# Patient Record
Sex: Female | Born: 1975 | Hispanic: Yes | Marital: Married | Smoking: Never smoker
Health system: Southern US, Community
[De-identification: ages and names within clinical notes are randomized; demographics above are authoritative.]

## PROBLEM LIST (undated history)

## (undated) ENCOUNTER — Emergency Department (HOSPITAL_COMMUNITY): Admission: EM | Payer: No Typology Code available for payment source

## (undated) DIAGNOSIS — T7840XA Allergy, unspecified, initial encounter: Secondary | ICD-10-CM

## (undated) DIAGNOSIS — Z789 Other specified health status: Secondary | ICD-10-CM

## (undated) HISTORY — DX: Allergy, unspecified, initial encounter: T78.40XA

---

## 2011-12-07 ENCOUNTER — Ambulatory Visit: Payer: Self-pay | Admitting: Family Medicine

## 2011-12-07 VITALS — BP 100/63 | HR 70 | Temp 98.3°F | Resp 16 | Ht 59.0 in | Wt 126.0 lb

## 2011-12-07 DIAGNOSIS — Z0000 Encounter for general adult medical examination without abnormal findings: Secondary | ICD-10-CM

## 2011-12-07 DIAGNOSIS — N898 Other specified noninflammatory disorders of vagina: Secondary | ICD-10-CM

## 2011-12-07 DIAGNOSIS — R300 Dysuria: Secondary | ICD-10-CM

## 2011-12-07 DIAGNOSIS — B9689 Other specified bacterial agents as the cause of diseases classified elsewhere: Secondary | ICD-10-CM

## 2011-12-07 LAB — POCT WET PREP WITH KOH
KOH Prep POC: NEGATIVE
Trichomonas, UA: NEGATIVE
Yeast Wet Prep HPF POC: NEGATIVE

## 2011-12-07 LAB — POCT GLYCOSYLATED HEMOGLOBIN (HGB A1C): Hemoglobin A1C: 5.6

## 2011-12-07 MED ORDER — CIPROFLOXACIN HCL 250 MG PO TABS: 250.0000 mg | ORAL_TABLET | Freq: Two times a day (BID) | ORAL | Status: AC

## 2011-12-07 MED ORDER — METRONIDAZOLE 500 MG PO TABS
500.0000 mg | ORAL_TABLET | Freq: Two times a day (BID) | ORAL | Status: AC
Start: 2011-12-07 — End: 2011-12-17

## 2011-12-07 NOTE — Progress Notes (Signed)
 [facility]:  Office Visit  Chief Complaint:  Chief Complaint  Patient presents with  . Annual Exam    CPE with PAP    HPI: Jenna Mayo is a 36 y.o. female who complains of  CPE with pap. Wants pap. Last pap 2 + years ago. No prior abnormal paps. No STDs.   Past Medical History  Diagnosis Date  . Allergy    Past Surgical History  Procedure Date  . Cesarean section    History   Social History  . Marital Status: Married    Spouse Name: N/A    Number of Children: N/A  . Years of Education: N/A   Social History Main Topics  . Smoking status: Never Smoker   . Smokeless tobacco: None  . Alcohol Use: No  . Drug Use: No  . Sexually Active: None   Other Topics Concern  . None   Social History Narrative  . None   Family History  Problem Relation Age of Onset  . Diabetes Mother   . Diabetes Father    No Known Allergies Prior to Admission medications   Medication Sig Start Date End Date Taking? Authorizing Provider  fexofenadine (ALLEGRA) 180 MG tablet Take 180 mg by mouth daily.   Yes Historical Provider, MD     ROS: The patient denies fevers, chills, night sweats, unintentional weight loss, chest pain, palpitations, wheezing, dyspnea on exertion, nausea, vomiting, abdominal pain, dysuria, hematuria, melena, numbness, weakness, or tingling.   All other systems have been reviewed and were otherwise negative with the exception of those mentioned in the HPI and as above.    PHYSICAL EXAM: Filed Vitals:   12/07/11 1843  BP: 100/63  Pulse: 70  Temp: 98.3 F (36.8 C)  Resp: 16   Filed Vitals:   12/07/11 1843  Height: 4\' 11"  (1.499 m)  Weight: 126 lb (57.153 kg)   Body mass index is 25.45 kg/(m^2).  General: Alert, no acute distress HEENT:  Normocephalic, atraumatic, oropharynx patent.  Cardiovascular:  Regular rate and rhythm, no rubs murmurs or gallops.  No Carotid bruits, radial pulse intact. No pedal edema.  Respiratory: Clear  to auscultation bilaterally.  No wheezes, rales, or rhonchi.  No cyanosis, no use of accessory musculature GI: No organomegaly, abdomen is soft and non-tender, positive bowel sounds.  No masses. Skin: No rashes. Neurologic: Facial musculature symmetric. Psychiatric: Patient is appropriate throughout our interaction. Lymphatic: No cervical lymphadenopathy Musculoskeletal: Gait intact. Breast exam normal GU-thick vaginal DC, no masses,lesions, no CMT, cervix nl   LABS: Results for orders placed in visit on 12/07/11  POCT WET PREP WITH KOH      Component Value Range   Trichomonas, UA Negative     Clue Cells Wet Prep HPF POC 2-4     Epithelial Wet Prep HPF POC 0-3     Yeast Wet Prep HPF POC neg     Bacteria Wet Prep HPF POC 5+     RBC Wet Prep HPF POC 2-5     WBC Wet Prep HPF POC tntc     KOH Prep POC Negative    POCT GLYCOSYLATED HEMOGLOBIN (HGB A1C)      Component Value Range   Hemoglobin A1C 5.6       EKG/XRAY:   Primary read interpreted by Dr. Conley Rolls at [facility].   ASSESSMENT/PLAN: Encounter Diagnoses  Name Primary?  . Physical exam, annual Yes  . Vaginal Discharge   . Dysuria   .  Bacterial vaginosis    Patient is self pay, she declined the normal labs that I would get except for the following: pap with G/C and also HBA1c She was given Cipro for questionable UTI sxs and  rx Flagyl for wet prep that showed BV.  Rx Cipro 250 BID x 3 days Rx Flagyl 500 mg BID x 7 days Pap with G/C pending. Patient is aware that after today if her pap is normal she does not need to return for pap for another 3 years but she still needs to have annual exam with pelvic    ,  PHUONG, DO 12/07/2011 8:07 PM

## 2011-12-12 LAB — PAP IG AND CT-NG NAA
Chlamydia Probe Amp: NEGATIVE
GC Probe Amp: NEGATIVE

## 2011-12-14 ENCOUNTER — Telehealth: Payer: Self-pay | Admitting: Family Medicine

## 2011-12-14 NOTE — Telephone Encounter (Signed)
LM that pap results nl. She does not need to get another pap for 3 more years, annuals still encouraged with pelvic exam.

## 2012-01-03 ENCOUNTER — Encounter: Payer: Self-pay | Admitting: Family Medicine

## 2013-05-21 ENCOUNTER — Ambulatory Visit: Payer: Self-pay | Admitting: Family Medicine

## 2013-05-21 VITALS — BP 130/80 | HR 116 | Temp 100.1°F | Resp 18 | Ht 58.5 in | Wt 137.0 lb

## 2013-05-21 DIAGNOSIS — R509 Fever, unspecified: Secondary | ICD-10-CM

## 2013-05-21 DIAGNOSIS — N390 Urinary tract infection, site not specified: Secondary | ICD-10-CM

## 2013-05-21 DIAGNOSIS — R300 Dysuria: Secondary | ICD-10-CM

## 2013-05-21 LAB — POCT URINALYSIS DIPSTICK
BILIRUBIN UA: NEGATIVE
GLUCOSE UA: NEGATIVE
KETONES UA: NEGATIVE
Nitrite, UA: NEGATIVE
PROTEIN UA: NEGATIVE
SPEC GRAV UA: 1.01
Urobilinogen, UA: 0.2
pH, UA: 7

## 2013-05-21 LAB — POCT UA - MICROSCOPIC ONLY
Casts, Ur, LPF, POC: NEGATIVE
Crystals, Ur, HPF, POC: NEGATIVE
MUCUS UA: POSITIVE
Yeast, UA: NEGATIVE

## 2013-05-21 MED ORDER — CIPROFLOXACIN HCL 500 MG PO TABS: 500.0000 mg | ORAL_TABLET | Freq: Two times a day (BID) | ORAL | Status: DC

## 2013-05-21 MED ORDER — HYDROCODONE-HOMATROPINE 5-1.5 MG/5ML PO SYRP: 5.0000 mL | ORAL_SOLUTION | Freq: Three times a day (TID) | ORAL | Status: DC | PRN

## 2013-05-21 MED ORDER — IBUPROFEN 600 MG PO TABS: 600.0000 mg | ORAL_TABLET | Freq: Three times a day (TID) | ORAL | Status: DC | PRN

## 2013-05-21 NOTE — Patient Instructions (Signed)
Urinary Tract Infection  Urinary tract infections (UTIs) can develop anywhere along your urinary tract. Your urinary tract is your body's drainage system for removing wastes and extra water. Your urinary tract includes two kidneys, two ureters, a bladder, and a urethra. Your kidneys are a pair of bean-shaped organs. Each kidney is about the size of your fist. They are located below your ribs, one on each side of your spine.  CAUSES  Infections are caused by microbes, which are microscopic organisms, including fungi, viruses, and bacteria. These organisms are so small that they can only be seen through a microscope. Bacteria are the microbes that most commonly cause UTIs.  SYMPTOMS   Symptoms of UTIs may vary by age and gender of the patient and by the location of the infection. Symptoms in young women typically include a frequent and intense urge to urinate and a painful, burning feeling in the bladder or urethra during urination. Older women and men are more likely to be tired, shaky, and weak and have muscle aches and abdominal pain. A fever may mean the infection is in your kidneys. Other symptoms of a kidney infection include pain in your back or sides below the ribs, nausea, and vomiting.  DIAGNOSIS  To diagnose a UTI, your caregiver will ask you about your symptoms. Your caregiver also will ask to provide a urine sample. The urine sample will be tested for bacteria and white blood cells. White blood cells are made by your body to help fight infection.  TREATMENT   Typically, UTIs can be treated with medication. Because most UTIs are caused by a bacterial infection, they usually can be treated with the use of antibiotics. The choice of antibiotic and length of treatment depend on your symptoms and the type of bacteria causing your infection.  HOME CARE INSTRUCTIONS   If you were prescribed antibiotics, take them exactly as your caregiver instructs you. Finish the medication even if you feel better after you  have only taken some of the medication.   Drink enough water and fluids to keep your urine clear or pale yellow.   Avoid caffeine, tea, and carbonated beverages. They tend to irritate your bladder.   Empty your bladder often. Avoid holding urine for long periods of time.   Empty your bladder before and after sexual intercourse.   After a bowel movement, women should cleanse from front to back. Use each tissue only once.  SEEK MEDICAL CARE IF:    You have back pain.   You develop a fever.   Your symptoms do not begin to resolve within 3 days.  SEEK IMMEDIATE MEDICAL CARE IF:    You have severe back pain or lower abdominal pain.   You develop chills.   You have nausea or vomiting.   You have continued burning or discomfort with urination.  MAKE SURE YOU:    Understand these instructions.   Will watch your condition.   Will get help right away if you are not doing well or get worse.  Document Released: 01/26/2005 Document Revised: 10/18/2011 Document Reviewed: 05/27/2011  ExitCare Patient Information 2014 [employer].

## 2013-05-21 NOTE — Progress Notes (Signed)
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  Associated headache and nausea, no vomiting.  Lost appetite.    Objective:  Appears febrile but in NAD HEENT:  Unremarkable Neck:  Supple, no adenopathy Chest:  Clear Heart:  Reg, no murmur Skin: clear  Dysuria - Plan: POCT urinalysis dipstick, POCT UA - Microscopic Only, ibuprofen (ADVIL,MOTRIN) 600 MG tablet, HYDROcodone-homatropine (HYCODAN) 5-1.5 MG/5ML syrup, ciprofloxacin (CIPRO) 500 MG tablet  Fever, unspecified - Plan: ibuprofen (ADVIL,MOTRIN) 600 MG tablet, ciprofloxacin (CIPRO) 500 MG tablet  Signed, Robyn Haber, MD

## 2013-12-02 ENCOUNTER — Encounter: Payer: Self-pay | Admitting: Family Medicine

## 2013-12-02 ENCOUNTER — Ambulatory Visit (INDEPENDENT_AMBULATORY_CARE_PROVIDER_SITE_OTHER): Payer: Self-pay | Admitting: Family Medicine

## 2013-12-02 VITALS — BP 102/68 | HR 90 | Temp 98.3°F | Resp 16 | Ht 58.5 in | Wt 138.0 lb

## 2013-12-02 DIAGNOSIS — H8110 Benign paroxysmal vertigo, unspecified ear: Secondary | ICD-10-CM

## 2013-12-02 DIAGNOSIS — G43909 Migraine, unspecified, not intractable, without status migrainosus: Secondary | ICD-10-CM

## 2013-12-02 DIAGNOSIS — R300 Dysuria: Secondary | ICD-10-CM

## 2013-12-02 LAB — POCT UA - MICROSCOPIC ONLY
CASTS, UR, LPF, POC: NEGATIVE
CRYSTALS, UR, HPF, POC: NEGATIVE
Mucus, UA: NEGATIVE
Yeast, UA: NEGATIVE

## 2013-12-02 LAB — POCT URINALYSIS DIPSTICK
Bilirubin, UA: NEGATIVE
Glucose, UA: NEGATIVE
KETONES UA: NEGATIVE
Leukocytes, UA: NEGATIVE
Nitrite, UA: NEGATIVE
PH UA: 5.5
PROTEIN UA: NEGATIVE
SPEC GRAV UA: 1.02
UROBILINOGEN UA: 0.2

## 2013-12-02 LAB — GLUCOSE, POCT (MANUAL RESULT ENTRY): POC Glucose: 115 mg/dl — AB (ref 70–99)

## 2013-12-02 MED ORDER — MECLIZINE HCL 25 MG PO TABS: ORAL_TABLET | ORAL | Status: DC

## 2013-12-02 MED ORDER — TRAMADOL HCL 50 MG PO TABS: ORAL_TABLET | ORAL | Status: DC

## 2013-12-02 MED ORDER — SULFAMETHOXAZOLE-TMP DS 800-160 MG PO TABS: 1.0000 | ORAL_TABLET | Freq: Two times a day (BID) | ORAL | Status: DC

## 2013-12-02 NOTE — Progress Notes (Signed)
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  For the last couple of weeks or so she's been having dizziness. She says if she turned her head quickly or gets up abruptly from bed or even rolls over in bed she'll get dizziness transiently. The last 5 days or so she's been having bad headaches. Especially in the left side but both sides of the head. She had paresthesia of the scalp and a sensation that water was being poured over her head. She said the scalp and cheek cannot hypersensitive to touch. She had no ringing in the ear. No visual changes. No generalized body or unilateral weakness. She did not. She has been nauseous but no vomiting. Her last menstrual cycle was just 3 days ago. She did have a little dysuria and wonders whether she could have something going on there. She does not smoke drink or use drugs. Not using contraception  Objective: Pleasant young lady in no acute distress, alert and oriented. Her TMs are normal. Eyes PERRLA. Her EOMs are intact. Throat clear. Neck supple without nodes. Chest clear. Heart regular without murmurs. Finger to nose normal. Heel toe gait normal. Motor strength symmetrical  Assessment: Benign positional vertigo Migraines Dysuria  Plan: Check urinalysis and glucose   Results for orders placed in visit on 12/02/13  POCT UA - MICROSCOPIC ONLY      Result Value Ref Range   WBC, Ur, HPF, POC 4-6     RBC, urine, microscopic 1-3     Bacteria, U Microscopic 2+     Mucus, UA neg     Epithelial cells, urine per micros 4-6     Crystals, Ur, HPF, POC neg     Casts, Ur, LPF, POC neg     Yeast, UA neg    POCT URINALYSIS DIPSTICK      Result Value Ref Range   Color, UA yellow     Clarity, UA cloudy     Glucose, UA neg     Bilirubin, UA neg     Ketones, UA neg     Spec Grav, UA 1.020     Blood, UA small     pH, UA 5.5     Protein, UA neg     Urobilinogen, UA 0.2     Nitrite, UA neg     Leukocytes, UA Negative    GLUCOSE,  POCT (MANUAL RESULT ENTRY)      Result Value Ref Range   POC Glucose 115 (*) 70 - 99 mg/dl   Tramadol for headache, meclizine for dizziness, and sulfamethoxazole for dysuria possible UTI  If dizziness persists we'll need to make referral to ENT

## 2013-12-02 NOTE — Patient Instructions (Addendum)
Take meclizine one half to one pill 3 times daily when needed for dizziness  Take tramadol one every 8 hours if needed for bad headache. For milder headache take Tylenol or Aleve or ibuprofen.  If not improving we will need to refer you to an ENT specialist for further evaluation  Recommend more regular exercise and making sure you're getting plenty of sleep.  Take sulfamethoxazole one twice daily for 3 days for possible infection of urine  Vrtigo postural benigno (Benign Positional Vertigo)  Vrtigo es la sensacin de que el entorno se mueve estando quieto. Es la forma ms frecuente de vrtigo. Benigno significa que la causa del trastorno no es grave. Es ms frecuente en adultos mayores. CAUSAS  Es el resultado de un trastorno en el sistema laberntico. Es una zona en el odo medio que ayuda a controlar el equilibrio. La causa puede ser una infeccin viral, una lesin en la cabeza o un movimiento repetitivo. Sin embargo, a menudo no se Research scientist (life sciences).  SNTOMAS  Los sntomas de vrtigo posicional benigno se producen al mover la cabeza o los ojos en diferentes direcciones. Algunos de los sntomas pueden ser:   Prdida de equilibrio y cadas.  Vmitos.  Visin borrosa.  Mareos.  Nuseas.  Movimientos oculares involuntarios (nistagmus). DIAGNSTICO  El vrtigo postural benigno se diagnostica mediante un examen fsico. Si la causa especfica de su vrtigo posicional benigno es desconocido, su mdico puede indicar diagnsticos por imgenes, como la Health visitor (RM) o la tomografa computada (TC).  TRATAMIENTO  El [employer] podr recomendar movimientos o procedimientos para corregir el vrtigo posicional benigno. Para tratar los sntomas pueden indicarse medicamentos como meclizina, benzodiazepinas y medicamentos para las nuseas. En casos raros, si los sntomas son causados   por ciertos trastornos que afectan el odo interno, es posible que necesite [place].  [place]part[place] indicaciones del mdico.  Muvase lentamente. No haga movimientos bruscos con la cabeza ni el cuerpo.  Evite conducir vehculos.  Evite operar maquinarias pesadas.  Evite realizar tareas que seran peligrosas para usted u otras personas durante un episodio de vrtigo.  Debe ingerir gran cantidad de lquido para mantener la orina de tono claro o color amarillo plido. SOLICITE ATENCIN [place] DE INMEDIATO SI:   Tiene dificultad para hablar, caminar, siente debilidad o tiene problemas para usar los brazos, las [place] piernas.  Tiene dificultad para respirar.  Sufre un dolor de cabeza intenso.  Las nuseas o los vmitos persisten o [place].  Tiene cambios en la visin.  Sus familiares o amigos notan cambios en su conducta.  El dolor [place].  Tiene fiebre.  Comienza a sentir rigidez en el cuello o sensibilidad a la luz. ASEGRESE DE QUE:   Comprende estas instrucciones.  Controlar su enfermedad.  Solicitar ayuda de inmediato si no mejora o si empeora. Document Released: 08/04/2008 Document Revised: 07/11/2011 [facility] Patient Information 2015 [place]. This information is not intended to replace advice given to you by your health care provider. Make sure you discuss any questions you have with your health care provider.

## 2013-12-05 LAB — URINE CULTURE: Colony Count: >=100000

## 2013-12-06 ENCOUNTER — Telehealth: Payer: Self-pay

## 2013-12-06 DIAGNOSIS — R300 Dysuria: Secondary | ICD-10-CM

## 2013-12-06 MED ORDER — SULFAMETHOXAZOLE-TMP DS 800-160 MG PO TABS: 1.0000 | ORAL_TABLET | Freq: Two times a day (BID) | ORAL | Status: DC

## 2013-12-06 NOTE — Telephone Encounter (Signed)
Advised pt- sent script to the pharmacy. Pt will RTC if symptoms persist to recheck.

## 2013-12-06 NOTE — Telephone Encounter (Signed)
[place]   Patient is requesting her test results of her urine.   Please call [phone]

## 2014-04-10 ENCOUNTER — Ambulatory Visit (INDEPENDENT_AMBULATORY_CARE_PROVIDER_SITE_OTHER): Payer: Self-pay | Admitting: Physician Assistant

## 2014-04-10 VITALS — BP 112/66 | HR 71 | Temp 97.8°F | Resp 16 | Ht 58.5 in | Wt 142.8 lb

## 2014-04-10 DIAGNOSIS — M62838 Other muscle spasm: Secondary | ICD-10-CM

## 2014-04-10 DIAGNOSIS — Z833 Family history of diabetes mellitus: Secondary | ICD-10-CM

## 2014-04-10 DIAGNOSIS — R300 Dysuria: Secondary | ICD-10-CM

## 2014-04-10 DIAGNOSIS — R5383 Other fatigue: Secondary | ICD-10-CM

## 2014-04-10 DIAGNOSIS — R200 Anesthesia of skin: Secondary | ICD-10-CM

## 2014-04-10 DIAGNOSIS — M6248 Contracture of muscle, other site: Secondary | ICD-10-CM

## 2014-04-10 DIAGNOSIS — Z658 Other specified problems related to psychosocial circumstances: Secondary | ICD-10-CM

## 2014-04-10 DIAGNOSIS — Z23 Encounter for immunization: Secondary | ICD-10-CM

## 2014-04-10 DIAGNOSIS — F439 Reaction to severe stress, unspecified: Secondary | ICD-10-CM

## 2014-04-10 DIAGNOSIS — L299 Pruritus, unspecified: Secondary | ICD-10-CM

## 2014-04-10 LAB — POCT CBC
GRANULOCYTE PERCENT: 58 % (ref 37–80)
HCT, POC: 41.4 % (ref 37.7–47.9)
Hemoglobin: 13 g/dL (ref 12.2–16.2)
LYMPH, POC: 2.7 (ref 0.6–3.4)
MCH, POC: 26.8 pg — AB (ref 27–31.2)
MCHC: 31.4 g/dL — AB (ref 31.8–35.4)
MCV: 85.5 fL (ref 80–97)
MID (cbc): 0.4 (ref 0–0.9)
MPV: 7.4 fL (ref 0–99.8)
POC Granulocyte: 4.3 (ref 2–6.9)
POC LYMPH %: 36.5 % (ref 10–50)
POC MID %: 5.5 %M (ref 0–12)
Platelet Count, POC: 241 10*3/uL (ref 142–424)
RBC: 4.84 M/uL (ref 4.04–5.48)
RDW, POC: 16.2 %
WBC: 7.4 10*3/uL (ref 4.6–10.2)

## 2014-04-10 LAB — COMPREHENSIVE METABOLIC PANEL
ALBUMIN: 4.4 g/dL (ref 3.5–5.2)
ALT: 23 U/L (ref 0–35)
AST: 18 U/L (ref 0–37)
Alkaline Phosphatase: 52 U/L (ref 39–117)
BILIRUBIN TOTAL: 0.5 mg/dL (ref 0.2–1.2)
BUN: 4 mg/dL — ABNORMAL LOW (ref 6–23)
CO2: 31 mEq/L (ref 19–32)
Calcium: 9.3 mg/dL (ref 8.4–10.5)
Chloride: 99 mEq/L (ref 96–112)
Creat: 0.56 mg/dL (ref 0.50–1.10)
Glucose, Bld: 95 mg/dL (ref 70–99)
Potassium: 4 mEq/L (ref 3.5–5.3)
Sodium: 137 mEq/L (ref 135–145)
Total Protein: 7.1 g/dL (ref 6.0–8.3)

## 2014-04-10 LAB — POCT URINALYSIS DIPSTICK
BILIRUBIN UA: NEGATIVE
GLUCOSE UA: NEGATIVE
KETONES UA: NEGATIVE
Leukocytes, UA: NEGATIVE
NITRITE UA: NEGATIVE
PH UA: 7
Protein, UA: NEGATIVE
SPEC GRAV UA: 1.01
Urobilinogen, UA: 0.2

## 2014-04-10 LAB — POCT UA - MICROSCOPIC ONLY
BACTERIA, U MICROSCOPIC: NEGATIVE
Casts, Ur, LPF, POC: NEGATIVE
Crystals, Ur, HPF, POC: NEGATIVE
Mucus, UA: NEGATIVE
YEAST UA: NEGATIVE

## 2014-04-10 LAB — POCT GLYCOSYLATED HEMOGLOBIN (HGB A1C): Hemoglobin A1C: 5.3

## 2014-04-10 MED ORDER — CYCLOBENZAPRINE HCL 10 MG PO TABS: 5.0000 mg | ORAL_TABLET | Freq: Three times a day (TID) | ORAL | Status: DC | PRN

## 2014-04-10 NOTE — Progress Notes (Signed)
Subjective:    Patient ID: Jenna Mayo, female    DOB: 1976-02-17, 38 y.o.   MRN: [id number]  PCP: No PCP Per Patient  Chief Complaint  Patient presents with  . Generalized Body Aches    x4 days  . sensitive skin    her skin feels very sensitive to the touch  . Nausea  . Headache  . Anorexia    has not ate anything in 2 days  . Numbness    in hands and feet  . Dizziness  . Chills   There are no active problems to display for this patient.  Prior to Admission medications   Medication Sig Start Date End Date Taking? Authorizing Provider  loratadine (CLARITIN) 10 MG tablet Take 10 mg by mouth daily.   Yes Historical Provider, MD   Medications, allergies, past medical history, surgical history, family history, social history and problem list reviewed and updated.  HPI  66 yof with PMH allergies and strong family hx DMII presents with multiple complaints including body aches, sensitive skin, nausea, HA, anorexia, numbness, dizziness, and chills.  Sx started approx 3-4 days ago with general fatigue. 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 She has been sleeping ok but has just been fatigued throughout the day. She has been eating a little more than normal lately and feels that she has gained a few lbs.   She noticed her skin has been sensitive for the past 2 days. This is all over her body but mostly located across trunk from hips up to shoulders. No issues over extremities. She feels sensitive but also maybe slightly itchy with certain clothes. Denies any new detergents, lotions, soaps, foods, meds.   She had a HA for past couple days which has resolved now. She has also been feeling nauseated the past few days. Has not had much of an appetite. Has been drinking fine. Her throat was also itchy 2 days ago. Last night she was reading about her sx on the internet and became concerned about fibromyalgia. She has been feeling like the water is  "bugging" her when she showers, as if it is irritating her skin.   She mentions that sometimes she will have sensitivity down one extremity. She occasionally feels SOB both at rest and with exertion. She cannot attribute this to anything specific. She denies any CP, PND, orthopnea, palps, fever, chills. Denies cough, ear pain, rhinorrhea, abd pain, diarrhea. Denies any muscle or joint pain though she has felt achy all over the past four days.   She has had intermittent dysuria for the past month.   She has had numbness in both hands and feet intermittently for the past 1-2 days. No aggravating or relieving factors. Denies any weakness.   She is concerned about DM as both her parents have it and brother was recently diagnosed.   Lot of stress recently. Divorced 5 months ago. Moved out for awhile but back in same home with 2 children. Works 60 hrs week as Freight forwarder at [employer]. Working more lately with holiday season.   Was seen here with vertigo earlier in the year. Was told to f/u with ENT if sx persisted, but they have resolved.   Review of Systems No night sweats, no unintentional wt loss. No vaginal dc. No constipation.     Objective:   Physical Exam  Constitutional: She is oriented to person, place, and time. She appears well-developed and well-nourished.  Non-toxic appearance. She does not have a sickly appearance. She does not appear ill. No distress.  BP 112/66 mmHg  Pulse 71  Temp(Src) 97.8 F (36.6 C) (Oral)  Resp 16  Ht 4' 10.5" (1.486 m)  Wt 142 lb 12.8 oz (64.774 kg)  BMI 29.33 kg/m2  SpO2 98%  LMP 04/08/2014   HENT:  Right Ear: Tympanic membrane normal.  Left Ear: Tympanic membrane normal.  Mouth/Throat: Uvula is midline, oropharynx is clear and moist and mucous membranes are normal. Mucous membranes are not dry. No oropharyngeal exudate, posterior oropharyngeal edema or posterior oropharyngeal erythema.  Eyes: Conjunctivae and EOM are normal. Pupils are equal,  round, and reactive to light.  Neck: Normal range of motion. Muscular tenderness present. No spinous process tenderness present. No Brudzinski's sign noted. No thyroid mass present.    Muscle spasm across trapezius muscle.   Cardiovascular: Regular rhythm and normal heart sounds.  Exam reveals no gallop.   No murmur heard. Pulmonary/Chest: Effort normal and breath sounds normal. She has no decreased breath sounds. She has no wheezes. She has no rhonchi. She has no rales.  Abdominal: Soft. Normal appearance and bowel sounds are normal. There is no splenomegaly or hepatomegaly. There is tenderness in the periumbilical area. There is no rigidity, no rebound, no guarding, no CVA tenderness, no tenderness at McBurney's point and negative Murphy's sign.    Very mild TTP just lateral to umbilicus.   Lymphadenopathy:       Head (right side): No submental, no submandibular and no tonsillar adenopathy present.       Head (left side): No submental, no submandibular and no tonsillar adenopathy present.    She has no cervical adenopathy.  Neurological: She is alert and oriented to person, place, and time. She has normal strength. She displays normal reflexes. No cranial nerve deficit or sensory deficit. Coordination and gait normal.  Normal rapid alternating movements, normal heel to shin. Normal proprioception with big toe bilaterally. Normal vibratory sense.   Skin: No rash noted.    Results for orders placed or performed in visit on 04/10/14  POCT UA - Microscopic Only  Result Value Ref Range   WBC, Ur, HPF, POC 0-1    RBC, urine, microscopic 0-1    Bacteria, U Microscopic neg    Mucus, UA neg    Epithelial cells, urine per micros 0-1    Crystals, Ur, HPF, POC neg    Casts, Ur, LPF, POC neg    Yeast, UA neg   POCT urinalysis dipstick  Result Value Ref Range   Color, UA yellow    Clarity, UA clear    Glucose, UA neg    Bilirubin, UA neg    Ketones, UA neg    Spec Grav, UA 1.010    Blood,  UA trace    pH, UA 7.0    Protein, UA neg    Urobilinogen, UA 0.2    Nitrite, UA neg    Leukocytes, UA Negative   POCT CBC  Result Value Ref Range   WBC 7.4 4.6 - 10.2 K/uL   Lymph, poc 2.7 0.6 - 3.4   POC LYMPH PERCENT 36.5 10 - 50 %L   MID (cbc) 0.4 0 - 0.9   POC MID % 5.5 0 - 12 %M   POC Granulocyte 4.3 2 - 6.9   Granulocyte percent 58.0 37 - 80 %G   RBC 4.84 4.04 - 5.48 M/uL   Hemoglobin 13.0 12.2 - 16.2 g/dL  HCT, POC 41.4 37.7 - 47.9 %   MCV 85.5 80 - 97 fL   MCH, POC 26.8 (A) 27 - 31.2 pg   MCHC 31.4 (A) 31.8 - 35.4 g/dL   RDW, POC 16.2 %   Platelet Count, POC 241 142 - 424 K/uL   MPV 7.4 0 - 99.8 fL  POCT glycosylated hemoglobin (Hb A1C)  Result Value Ref Range   Hemoglobin A1C 5.3       Assessment & Plan:   89 yof with PMH allergies and strong family hx DMII presents with multiple complaints including body aches, sensitive skin, nausea, HA, anorexia, numbness, dizziness, and chills.  Dysuria - Plan: POCT UA - Microscopic Only, POCT urinalysis dipstick --ua normal  Other fatigue - Plan: POCT CBC, Comprehensive metabolic panel, TSH Itch of skin --no rash, normal exam --cbc normal today, f/u with tsh, cmp  Family history of diabetes mellitus type II - Plan: POCT glycosylated hemoglobin (Hb A1C) --a1c normal today and actually improved from 2 yrs ago --pt informed and very relieved due to family hx  Numbness - Plan: Comprehensive metabolic panel, Vitamin F29 --1-2 day h/o hand/foot numbness without weakness --cmp, b12 --normal neuro exam with normal sensation  Need for prophylactic vaccination and inoculation against influenza - Plan: CANCELED: Flu Vaccine QUAD 36+ mos IM --pt to get at pharmacy due to cost  Muscle spasms of neck - Plan: cyclobenzaprine (FLEXERIL) 10 MG tablet --flexeril --heat/ice --light rom --massage if possible  Her varied symptoms could be due to psychosomatic syndrome with recent life changes and stress. If remainder of labs come  back normal, along with normal exam today, we will plan to see her back next month after the holidays. Pt informed one possibility could be stress causing her sx. She agrees this may be the cause. We will see how she's doing next month and if still having these sx do a further workup along with talk more about stress and anxiety.   Julieta Gutting, PA-C Physician Assistant-Certified [facility]  04/10/2014 5:30 PM

## 2014-04-10 NOTE — Progress Notes (Signed)
The patient was discussed with me and I agree with the diagnosis and treatment plan.  

## 2014-04-10 NOTE — Patient Instructions (Signed)
Your labs we drew today looked great as did your urine sample. You do not have a urinary tract infection, you do not have any infection, your 3 month blood sugar reading we looked at was great. You are not diabetic or prediabetic. Your sugar actually was better today than it was 2 years ago. I will contact you with the results of the other labs we drew.  If those all come back normal, this may be what is called psychosomatic. This means that the stress you're going through actually causes your body to have discomfort. Stress can actually cause people to have symptoms that you've been having. That is not definitely what is causing your symptoms, but it is one possibility. If your labs all come back normal, please come back to clinic in January after the stress of the holiday season is over. We could further discuss at that time different strategies that may help moving forward.  If you begin to feel any worse in the meantime, please be sure to return to clinic.

## 2014-04-11 LAB — TSH: TSH: 1.497 u[IU]/mL (ref 0.350–4.500)

## 2014-04-11 LAB — VITAMIN B12

## 2015-06-15 ENCOUNTER — Ambulatory Visit (INDEPENDENT_AMBULATORY_CARE_PROVIDER_SITE_OTHER): Payer: Self-pay | Admitting: Family Medicine

## 2015-06-15 ENCOUNTER — Encounter: Payer: Self-pay | Admitting: Family Medicine

## 2015-06-15 VITALS — BP 103/59 | HR 73 | Temp 98.4°F | Ht <= 58 in | Wt 145.4 lb

## 2015-06-15 DIAGNOSIS — F32A Depression, unspecified: Secondary | ICD-10-CM

## 2015-06-15 DIAGNOSIS — Z1151 Encounter for screening for human papillomavirus (HPV): Secondary | ICD-10-CM

## 2015-06-15 DIAGNOSIS — F329 Major depressive disorder, single episode, unspecified: Secondary | ICD-10-CM

## 2015-06-15 DIAGNOSIS — Z01419 Encounter for gynecological examination (general) (routine) without abnormal findings: Secondary | ICD-10-CM

## 2015-06-15 DIAGNOSIS — Z124 Encounter for screening for malignant neoplasm of cervix: Secondary | ICD-10-CM

## 2015-06-15 DIAGNOSIS — H8110 Benign paroxysmal vertigo, unspecified ear: Secondary | ICD-10-CM

## 2015-06-15 LAB — POCT URINALYSIS DIP (DEVICE)
Bilirubin Urine: NEGATIVE
Glucose, UA: NEGATIVE mg/dL
KETONES UR: NEGATIVE mg/dL
Nitrite: NEGATIVE
PROTEIN: NEGATIVE mg/dL
Specific Gravity, Urine: 1.01 (ref 1.005–1.030)
UROBILINOGEN UA: 0.2 mg/dL (ref 0.0–1.0)
pH: 6 (ref 5.0–8.0)

## 2015-06-15 MED ORDER — CITALOPRAM HYDROBROMIDE 20 MG PO TABS: 20.0000 mg | ORAL_TABLET | Freq: Every day | ORAL | Status: DC

## 2015-06-15 NOTE — Progress Notes (Signed)
Here for physical.Requested urinalyis-c/o urinary frequency, urinalyis obtained. C/o depression, headaches, varicose veins.

## 2015-06-15 NOTE — Progress Notes (Signed)
Patient ID: Jenna Mayo, female   DOB: 07-17-1975, 40 y.o.   MRN: [id number]   CLINIC ENCOUNTER NOTE  History:  40 y.o. [id number] here today for routine physical . She denies any abnormal vaginal discharge, bleeding, pelvic pain or other concerns.   Past Medical History  Diagnosis Date  . Allergy    #HCM needs pap and breast cancer screening  #Dizziness- feels like the room is spinning.  - several day, started last Wednesday - overall improving since it started.   #Depression -history of problems in the past -Reports "feeling in a hole" and she reports generally anti-meds  -Denies SI/HI +lack concentration, +sadness, +fatigue/insomnia. +adhedonia PHQ9=14  #Menorrhagia with regular cycle: Menstrual cycle 9 days, uses 6-7 pads per day  Past Surgical History  Procedure Laterality Date  . Cesarean section     The following portions of the patient's history were reviewed and updated as appropriate: allergies, current medications, past family history, past medical history, past social history, past surgical history and problem list.   Health Maintenance:  Normal pap and negative 3 years ago.  Needs mamo   Review of Systems:  Pertinent items noted in HPI and remainder of comprehensive ROS otherwise negative.  Objective:  Physical Exam BP 103/59 mmHg  Pulse 73  Temp(Src) 98.4 F (36.9 C)  Ht 4\' 9"  (1.448 m)  Wt 145 lb 6.4 oz (65.953 kg)  BMI 31.46 kg/m2  LMP 06/12/2015 CONSTITUTIONAL: Well-developed, well-nourished female in no acute distress.  HENT:  Normocephalic, atraumatic. EYES:  No scleral icterus.  NECK: Normal range of motion, supple, no masses SKIN: Skin is warm and dry. No rash noted. Not diaphoretic. No erythema. No pallor. [place]: Alert and oriented to person, place, and time. Normal reflexes, muscle tone coordination. No cranial nerve deficit noted. PSYCHIATRIC: Depressed. +SIGECAPS. Congruent. No SI/HI CARDIOVASCULAR: Normal heart rate  noted RESPIRATORY: Effort and breath sounds normal, no problems with respiration noted ABDOMEN: Soft, no distention noted.   PELVIC: Normal appearing external genitalia; normal appearing vaginal mucosa and cervix.  No abnormal discharge noted.  Normal uterine size, no other palpable masses, no uterine or adnexal tenderness. MUSCULOSKELETAL: Normal range of motion. No edema noted.  Labs and Imaging No results found.  Assessment & Plan:   1. Benign positional vertigo, unspecified laterality Provided handout on epley manuevers  2. Depression -Severe depression, PHQ 14. No red flag sx. Will start therapy and encouraged her to establish PCP care in 4-6 weeks.  - citalopram (CELEXA) 20 MG tablet; Take 1 tablet (20 mg total) by mouth daily.  Dispense: 30 tablet; Refill: 12  3. Well woman exam with routine gynecological exam - Cytology - PAP - Set up for mammogram -recommended scheduled NSAID around cycle for heavy periods.  -Discussed possible IUD if not improved and reviewed availability at [facility]  Routine preventative health maintenance measures emphasized. Please refer to After Visit Summary for other counseling recommendations.   Return if symptoms worsen or fail to improve.

## 2015-06-15 NOTE — Patient Instructions (Addendum)
For your Depression:  You have been prescribed an antidepressant medication called Celexa (citalopram). Take One 20mg  tablet per day. This medication typically takes 4-6 weeks to have an effect and you should follow up with your primary doctor in 6-8 weeks. If your depression symptoms worsen or you have thoughts of hurting yourself or others, make an appointment to see your doctor as soon as possible or go directly to your local emergency department.  What is this medicine? CITALOPRAM (sye TAL oh pram) is a medicine for depression. This medicine may be used for other purposes; ask your health care provider or pharmacist if you have questions. What should I tell my health care provider before I take this medicine? They need to know if you have any of these conditions: -bipolar disorder or a family history of bipolar disorder -diabetes -glaucoma -heart disease -history of irregular heartbeat -kidney or liver disease -low levels of magnesium or potassium in the blood -receiving electroconvulsive therapy -seizures (convulsions) -suicidal thoughts or a previous suicide attempt -an unusual or allergic reaction to citalopram, escitalopram, other medicines, foods, dyes, or preservatives -pregnant or trying to become pregnant -breast-feeding How should I use this medicine? Take this medicine by mouth with a glass of water. Follow the directions on the prescription label. You can take it with or without food. Take your medicine at regular intervals. Do not take your medicine more often than directed. Do not stop taking this medicine suddenly except upon the advice of your doctor. Stopping this medicine too quickly may cause serious side effects or your condition may worsen. A special MedGuide will be given to you by the pharmacist with each prescription and refill. Be sure to read this information carefully each time. Talk to your pediatrician regarding the use of this medicine in children. Special  care may be needed. Patients over 74 years old may have a stronger reaction and need a smaller dose. Overdosage: If you think you have taken too much of this medicine contact a poison control center or emergency room at once. NOTE: This medicine is only for you. Do not share this medicine with others. What if I miss a dose? If you miss a dose, take it as soon as you can. If it is almost time for your next dose, take only that dose. Do not take double or extra doses. What may interact with this medicine? Do not take this medicine with any of the following medications: -certain medicines for fungal infections like fluconazole, itraconazole, ketoconazole, posaconazole, voriconazole -cisapride -dofetilide -dronedarone -escitalopram -linezolid -MAOIs like Carbex, Eldepryl, Marplan, Nardil, and Parnate -methylene blue (injected into a vein) -pimozide -thioridazine -ziprasidone This medicine may also interact with the following medications: -alcohol -aspirin and aspirin-like medicines -carbamazepine -certain medicines for depression, anxiety, or psychotic disturbances -certain medicines for infections like chloroquine, clarithromycin, erythromycin, furazolidone, isoniazid, pentamidine -certain medicines for migraine headaches like almotriptan, eletriptan, frovatriptan, naratriptan, rizatriptan, sumatriptan, zolmitriptan -certain medicines for sleep -certain medicines that treat or prevent blood clots like dalteparin, enoxaparin, warfarin -cimetidine -diuretics -fentanyl -lithium -methadone -metoprolol -NSAIDs, medicines for pain and inflammation, like ibuprofen or naproxen -omeprazole -other medicines that prolong the QT interval (cause an abnormal heart rhythm) -procarbazine -rasagiline -supplements like St. John's wort, kava kava, valerian -tramadol -tryptophan This list may not describe all possible interactions. Give your health care provider a list of all the medicines, herbs,  non-prescription drugs, or dietary supplements you use. Also tell them if you smoke, drink alcohol, or use illegal drugs. Some items may  interact with your medicine. What should I watch for while using this medicine? Tell your doctor if your symptoms do not get better or if they get worse. Visit your doctor or health care professional for regular checks on your progress. Because it may take several weeks to see the full effects of this medicine, it is important to continue your treatment as prescribed by your doctor. Patients and their families should watch out for new or worsening thoughts of suicide or depression. Also watch out for sudden changes in feelings such as feeling anxious, agitated, panicky, irritable, hostile, aggressive, impulsive, severely restless, overly excited and hyperactive, or not being able to sleep. If this happens, especially at the beginning of treatment or after a change in dose, call your health care professional. Dennis Bast may get drowsy or dizzy. Do not drive, use machinery, or do anything that needs mental alertness until you know how this medicine affects you. Do not stand or sit up quickly, especially if you are an older patient. This reduces the risk of dizzy or fainting spells. Alcohol may interfere with the effect of this medicine. Avoid alcoholic drinks. Your mouth may get dry. Chewing sugarless gum or sucking hard candy, and drinking plenty of water will help. Contact your doctor if the problem does not go away or is severe. What side effects may I notice from receiving this medicine? Side effects that you should report to your doctor or health care professional as soon as possible: -allergic reactions like skin rash, itching or hives, swelling of the face, lips, or tongue -chest pain -confusion -dizziness -fast, irregular heartbeat -fast talking and excited feelings or actions that are out of control -feeling faint or lightheaded, falls -hallucination, loss of contact  with reality -seizures -shortness of breath -suicidal thoughts or other mood changes -unusual bleeding or bruising Side effects that usually do not require medical attention (report to your doctor or health care professional if they continue or are bothersome): -blurred vision -change in appetite -change in sex drive or performance -headache -increased sweating -nausea -trouble sleeping This list may not describe all possible side effects. Call your doctor for medical advice about side effects. You may report side effects to FDA at 1-800-FDA-1088. Where should I keep my medicine? Keep out of reach of children. Store at room temperature between 15 and 30 degrees C (59 and 86 degrees F). Throw away any unused medicine after the expiration date. NOTE: This sheet is a summary. It may not cover all possible information. If you have questions about this medicine, talk to your doctor, pharmacist, or health care provider.    2016, Elsevier/Gold Standard. (2012-11-09 13:19:48)    For your heavy and painful periods  Taking ibuprofen regularly 1-2 days before the start of your period can help decrease the discomfort and nausea associated with your period. Ibuprofen is a Non-Steroidal Anti-Inflammatory Drug (NSAID) and is the active ingredient in medications such as Advil and Motrin. It can be purchased without prescription at your local pharmacy or grocery store. Take 400 mg every 6 hours starting 1-2 days before the start of your period to help with heavy and painful periods.   What are some side effects that I need to call my doctor about right away?  Even though it may be rare, some people may have very bad and sometimes deadly side effects when taking a drug. Tell your doctor or get medical help right away if you have any of the following signs or symptoms that may be  related to a very bad side effect:  . Signs of an allergic reaction, like rash; hives; itching; red, swollen, blistered, or  peeling skin with or without fever; wheezing; tightness in the chest or throat; trouble breathing or talking; unusual hoarseness; or swelling of the mouth, face, lips, tongue, or throat.  . Signs of bleeding like throwing up blood or throw up that looks like coffee grounds; coughing up blood; blood in the urine; black, red, or tarry stools; bleeding from the gums; vaginal bleeding that is not normal; bruises without a reason or that get bigger; or any bleeding that is very bad or that you cannot stop.  . Signs of kidney problems like unable to pass urine, change in how much urine is passed, blood in the urine, or a big weight gain.  . Signs of high potassium levels like a heartbeat that does not feel normal; feeling confused; feeling weak, lightheaded, or dizzy; feeling like passing out; numbness or tingling; or shortness of breath.  . Signs of high blood pressure like very bad headache or dizziness, passing out, or change in eyesight.  . Shortness of breath, a big weight gain, swelling in the arms or legs.  . Chest pain or pressure or a fast heartbeat.  . Weakness on 1 side of the body, trouble speaking or thinking, change in balance, drooping on one side of the face, or blurred eyesight.  . Feeling very tired or weak.  . Ringing in ears.  . Very upset stomach or throwing up.  . Very bad belly pain.  . Very bad back pain.  . Change in eyesight.  . A very bad skin reaction (Stevens-Johnson syndrome/toxic epidermal necrolysis) may happen. It can cause very bad health problems that may not go away, and sometimes death. Get medical help right away if you have signs like red, swollen, blistered, or peeling skin (with or without fever); red or irritated eyes; or sores in your mouth, throat, nose, or eyes.    Epley Maneuver Self-Care WHAT IS THE EPLEY MANEUVER? The Epley maneuver is an exercise you can do to relieve symptoms of benign paroxysmal positional vertigo (BPPV). This condition is often just  referred to as vertigo. BPPV is caused by the movement of tiny crystals (canaliths) inside your inner ear. The accumulation and movement of canaliths in your inner ear causes a sudden spinning sensation (vertigo) when you move your head to certain positions. Vertigo usually lasts about 30 seconds. BPPV usually occurs in just one ear. If you get vertigo when you lie on your left side, you probably have BPPV in your left ear. Your health care provider can tell you which ear is involved.  BPPV may be caused by a head injury. Many people older than 50 get BPPV for unknown reasons. If you have been diagnosed with BPPV, your health care provider may teach you how to do this maneuver. BPPV is not life threatening (benign) and usually goes away in time.  WHEN SHOULD I PERFORM THE EPLEY MANEUVER? You can do this maneuver at home whenever you have symptoms of vertigo. You may do the Epley maneuver up to 3 times a day until your symptoms of vertigo go away. HOW SHOULD I DO THE EPLEY MANEUVER? 1. Sit on the edge of a bed or table with your back straight. Your legs should be extended or hanging over the edge of the bed or table.  2. Turn your head halfway toward the affected ear.  3. Lie backward quickly  with your head turned until you are lying flat on your back. You may want to position a pillow under your shoulders.  4. Hold this position for 30 seconds. You may experience an attack of vertigo. This is normal. Hold this position until the vertigo stops. 5. Then turn your head to the opposite direction until your unaffected ear is facing the floor.  6. Hold this position for 30 seconds. You may experience an attack of vertigo. This is normal. Hold this position until the vertigo stops. 7. Now turn your whole body to the same side as your head. Hold for another 30 seconds.  8. You can then sit back up. ARE THERE RISKS TO THIS MANEUVER? In some cases, you may have other symptoms (such as changes in your  vision, weakness, or numbness). If you have these symptoms, stop doing the maneuver and call your health care provider. Even if doing these maneuvers relieves your vertigo, you may still have dizziness. Dizziness is the sensation of light-headedness but without the sensation of movement. Even though the Epley maneuver may relieve your vertigo, it is possible that your symptoms will return within 5 years. WHAT SHOULD I DO AFTER THIS MANEUVER? After doing the Epley maneuver, you can return to your normal activities. Ask your doctor if there is anything you should do at home to prevent vertigo. This may include:  Sleeping with two or more pillows to keep your head elevated.  Not sleeping on the side of your affected ear.  Getting up slowly from bed.  Avoiding sudden movements during the day.  Avoiding extreme head movement, like looking up or bending over.  Wearing a cervical collar to prevent sudden head movements. WHAT SHOULD I DO IF MY SYMPTOMS GET WORSE? Call your health care provider if your vertigo gets worse. Call your provider right way if you have other symptoms, including:   Nausea.  Vomiting.  Headache.  Weakness.  Numbness.  Vision changes.   This information is not intended to replace advice given to you by your health care provider. Make sure you discuss any questions you have with your health care provider.   Document Released: 04/23/2013 Document Reviewed: 04/23/2013 Elsevier Interactive Patient Education [employer].

## 2015-06-16 ENCOUNTER — Other Ambulatory Visit (HOSPITAL_COMMUNITY): Payer: Self-pay | Admitting: Family Medicine

## 2015-06-16 DIAGNOSIS — Z1231 Encounter for screening mammogram for malignant neoplasm of breast: Secondary | ICD-10-CM

## 2015-06-16 LAB — CYTOLOGY - PAP

## 2015-06-25 ENCOUNTER — Ambulatory Visit
Admission: RE | Admit: 2015-06-25 | Discharge: 2015-06-25 | Disposition: A | Discharge status: Home / Self Care | Payer: No Typology Code available for payment source | Source: Ambulatory Visit | Attending: Family Medicine | Admitting: Family Medicine

## 2015-06-25 DIAGNOSIS — Z1231 Encounter for screening mammogram for malignant neoplasm of breast: Secondary | ICD-10-CM

## 2015-06-26 ENCOUNTER — Telehealth: Payer: Self-pay

## 2015-06-26 NOTE — Telephone Encounter (Signed)
Pt has been informed of mammogram results.

## 2017-01-26 IMAGING — MG MS DIGITAL SCREENING BILATERAL
4 series · 4 of 4 positions shown · non-contrast
Comparison: None.

CLINICAL DATA: Screening.

EXAM:
DIGITAL SCREENING BILATERAL MAMMOGRAM WITH CAD

[R CC]
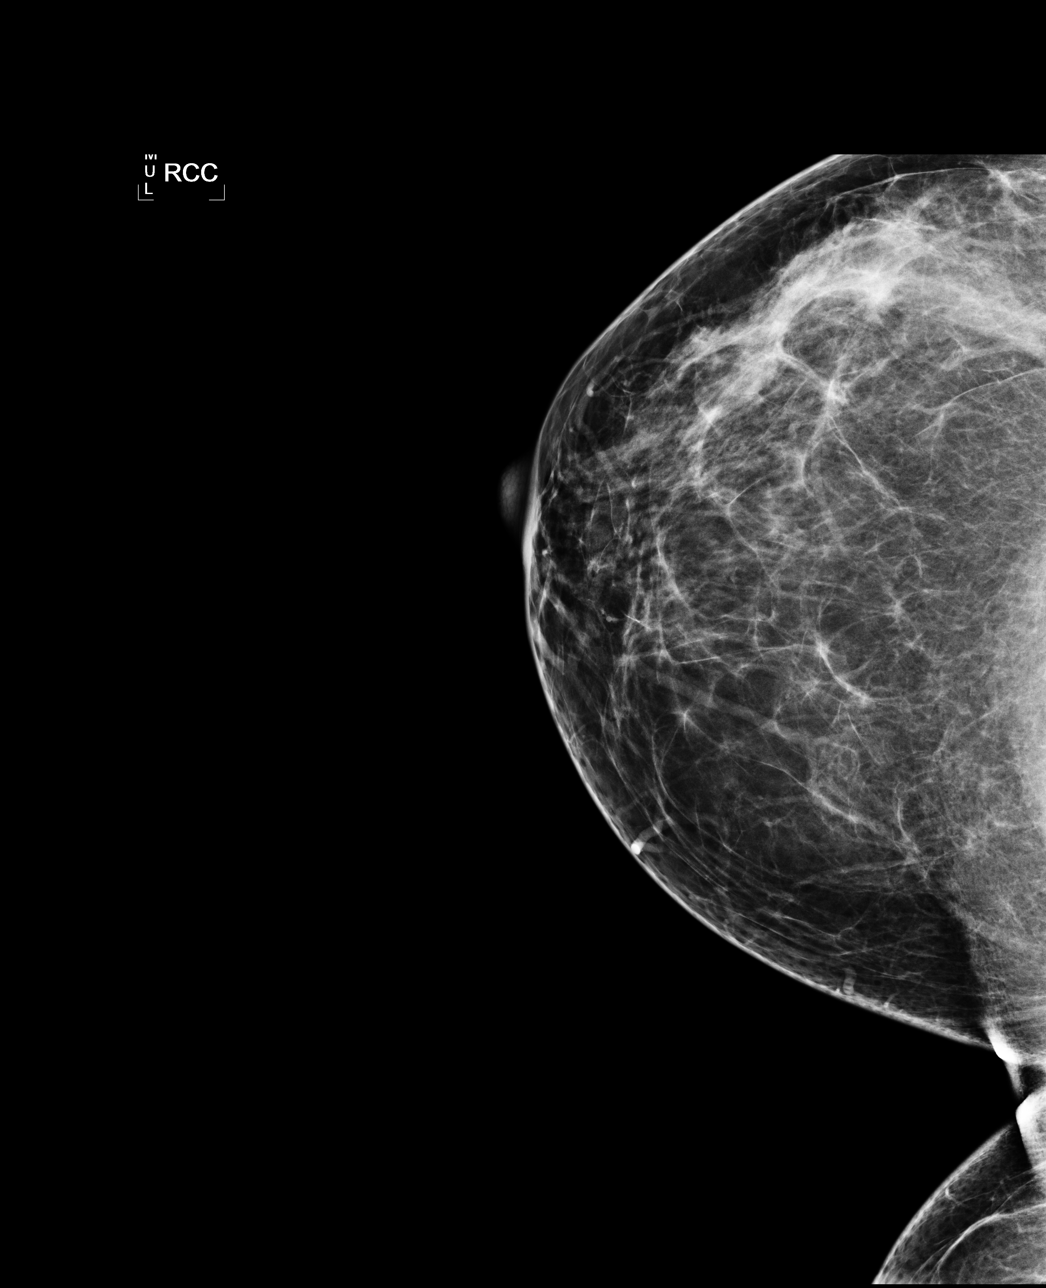

[L CC]
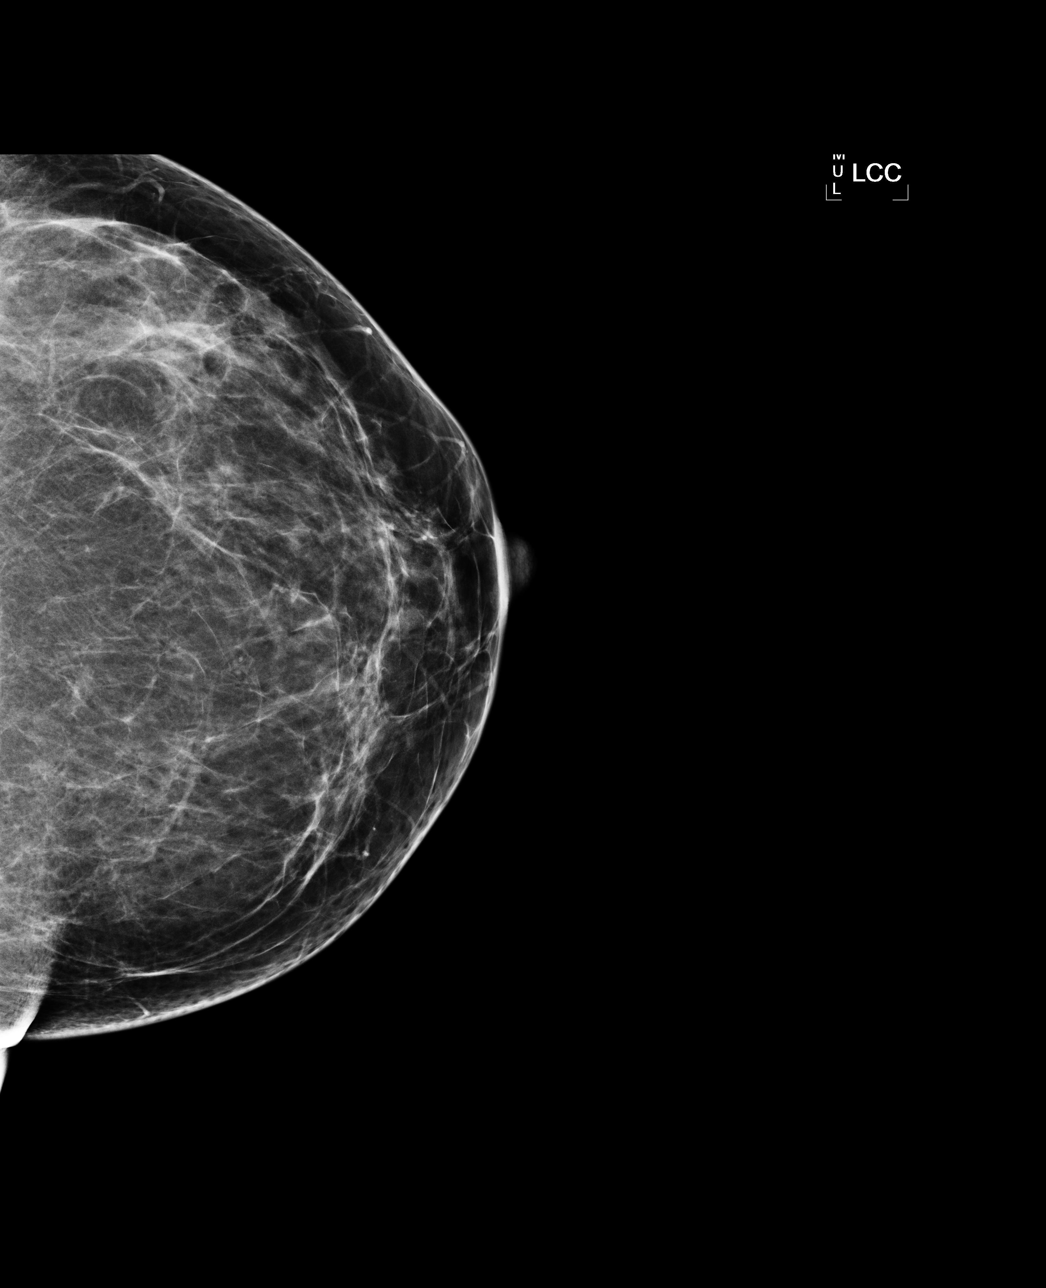

[R MLO]
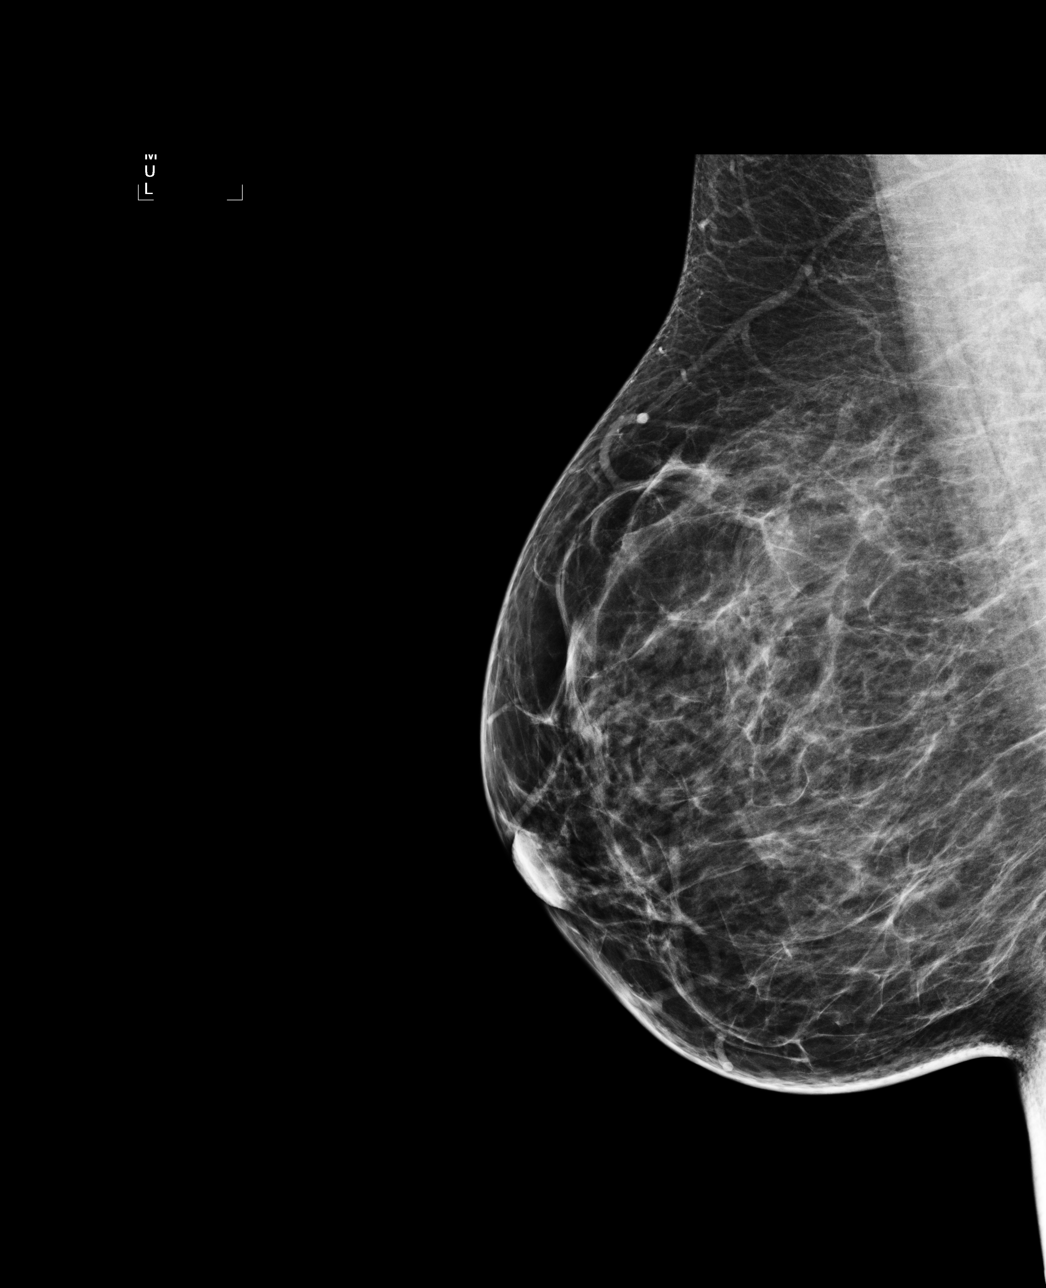

[L MLO]
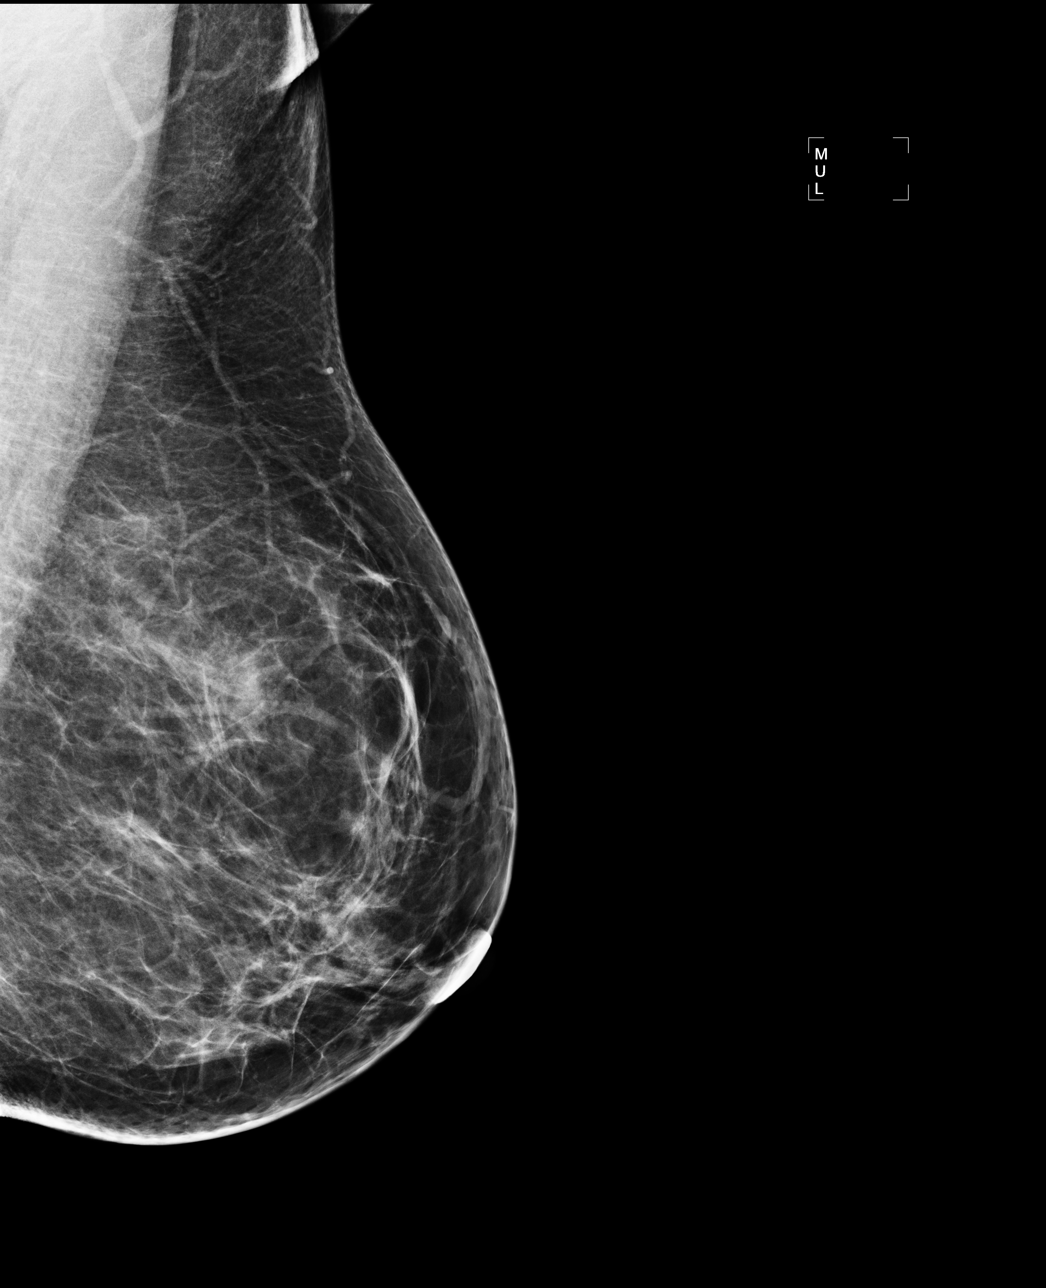

[4 of 4 positions shown; findings below may reference images not displayed]

ACR Breast Density Category b: There are scattered areas of
fibroglandular density.
FINDINGS: There are no findings suspicious for malignancy. Images were
processed with CAD.
IMPRESSION: No mammographic evidence of malignancy. A result letter of this
screening mammogram will be mailed directly to the patient.

RECOMMENDATION:
Screening mammogram in one year. (Code:[id number])

BI-RADS CATEGORY  1: Negative.

## 2018-09-18 ENCOUNTER — Other Ambulatory Visit: Payer: Self-pay

## 2018-09-20 ENCOUNTER — Ambulatory Visit: Payer: Self-pay | Admitting: Obstetrics & Gynecology

## 2018-09-20 ENCOUNTER — Other Ambulatory Visit: Payer: Self-pay

## 2018-09-20 ENCOUNTER — Encounter: Payer: Self-pay | Admitting: Obstetrics & Gynecology

## 2018-09-20 VITALS — BP 124/76 | Ht <= 58 in | Wt 154.0 lb

## 2018-09-20 DIAGNOSIS — Z30011 Encounter for initial prescription of contraceptive pills: Secondary | ICD-10-CM

## 2018-09-20 DIAGNOSIS — N920 Excessive and frequent menstruation with regular cycle: Secondary | ICD-10-CM

## 2018-09-20 DIAGNOSIS — Z1151 Encounter for screening for human papillomavirus (HPV): Secondary | ICD-10-CM

## 2018-09-20 DIAGNOSIS — Z113 Encounter for screening for infections with a predominantly sexual mode of transmission: Secondary | ICD-10-CM

## 2018-09-20 DIAGNOSIS — Z01419 Encounter for gynecological examination (general) (routine) without abnormal findings: Secondary | ICD-10-CM

## 2018-09-20 MED ORDER — NORETHIN ACE-ETH ESTRAD-FE 1-20 MG-MCG(24) PO TABS: 1.0000 | ORAL_TABLET | Freq: Every day | ORAL | 4 refills | Status: DC

## 2018-09-20 NOTE — Progress Notes (Signed)
Jenna Mayo Jenna Mayo Jenna Mayo 1975-05-12 [id number]   History:    43 y.o. G2P2L2 Divorced  RP:  New patient presenting for annual gyn exam   HPI: Longstanding long/heavy/painful menstrual periods every month.  No BTB.  No pelvic pain otherwise.  Sexually active with ex-husband.  No abnormal vaginal discharge.  Urine and bowel movements normal.  Breast normal.  Body mass index 32.19.  Not very active physically.  Past medical history,surgical history, family history and social history were all reviewed and documented in the EPIC chart.  Gynecologic History Patient's last menstrual period was 09/13/2018. Contraception: none Last Pap: 06/2015.  Results were:  Negative/HPV HR neg Last mammogram: 06/2015. Results were: Negative Bone Density: Never Colonoscopy: Never  Obstetric History OB History  Gravida Para Term Preterm AB Living  '2 2 2     2  '$ SAB TAB Ectopic Multiple Live Births          2    # Outcome Date GA Lbr Len/2nd Weight Sex Delivery Anes PTL Lv  2 Term 2004 [redacted]w[redacted]d 8 lb (3.629 kg) F CS-Unspec   LIV  1 Term 2003 [id number] lb 5 oz (3.317 kg)  CS-Unspec   LIV     Birth Comments: no complications     ROS: A ROS was performed and pertinent positives and negatives are included in the history.  GENERAL: No fevers or chills. HEENT: No change in vision, no earache, sore throat or sinus congestion. NECK: No pain or stiffness. CARDIOVASCULAR: No chest pain or pressure. No palpitations. PULMONARY: No shortness of breath, cough or wheeze. GASTROINTESTINAL: No abdominal pain, nausea, vomiting or diarrhea, melena or bright red blood per rectum. GENITOURINARY: No urinary frequency, urgency, hesitancy or dysuria. MUSCULOSKELETAL: No joint or muscle pain, no back pain, no recent trauma. DERMATOLOGIC: No rash, no itching, no lesions. ENDOCRINE: No polyuria, polydipsia, no heat or cold intolerance. No recent change in weight. HEMATOLOGICAL: No anemia or easy bruising or bleeding. NEUROLOGIC:  No headache, seizures, numbness, tingling or weakness. PSYCHIATRIC: No depression, no loss of interest in normal activity or change in sleep pattern.     Exam:   Ht '4\' 10"'$  (1.473 m)   Wt 154 lb (69.9 kg)   LMP 09/13/2018   BMI 32.19 kg/m   Body mass index is 32.19 kg/m.  General appearance : Well developed well nourished female. No acute distress HEENT: Eyes: no retinal hemorrhage or exudates,  Neck supple, trachea midline, no carotid bruits, no thyroidmegaly Lungs: Clear to auscultation, no rhonchi or wheezes, or rib retractions  Heart: Regular rate and rhythm, no murmurs or gallops Breast:Examined in sitting and supine position were symmetrical in appearance, no palpable masses or tenderness,  no skin retraction, no nipple inversion, no nipple discharge, no skin discoloration, no axillary or supraclavicular lymphadenopathy Abdomen: no palpable masses or tenderness, no rebound or guarding Extremities: no edema or skin discoloration or tenderness  Pelvic: Vulva: Normal             Vagina: No gross lesions or discharge  Cervix: No gross lesions or discharge.  Pap/HPV HR done.  Gono-Chlam on Pap.  Uterus  AV, normal size, shape and consistency, non-tender and mobile  Adnexa  Without masses or tenderness  Anus: Normal   Assessment/Plan:  [age].o. female for annual exam   1. Encounter for routine gynecological examination with Papanicolaou smear of cervix Normal gynecologic exam.  Pap test with high-risk HPV done.  Breast exam normal.  Patient  will schedule a screening mammogram now.  We will follow-up for health labs.  Body mass index 32.19.  Recommend a lower calorie/carb diet such as [employer] and increased physical activity with aerobic activities 5 times a week and weightlifting every 2 days. - CBC; Future - Comp Met (CMET); Future - TSH; Future - Lipid panel; Future - VITAMIN D 25 Hydroxy (Vit-D Deficiency, Fractures); Future  2. Encounter for initial prescription of  contraceptive pills Started on norethindrone acetate ethinyl estradiol FE 24 1/20.  No contraindication to birth control pills.  Usage, risks and benefits reviewed with patient.  Prescription sent to pharmacy.  3. Menorrhagia with regular cycle Decision to start on birth control pill to control her heavy menstrual periods as well as for contraception.  We will follow-up with a pelvic ultrasound to evaluate the endometrial cavity and rule out pathology. - US Transvaginal Non-OB; Future - CBC; Future - TSH; Future  4. Screen for STD (sexually transmitted disease) Strict condom use recommended - Gono-Chlam on Pap - HIV antibody (with reflex); Future - RPR; Future - Hepatitis C Antibody; Future - Hepatitis B Surface AntiGEN; Future  Other orders - Norethindrone Acetate-Ethinyl Estrad-FE (LOESTRIN 24 FE) 1-20 MG-MCG(24) tablet; Take 1 tablet by mouth daily. Continuous use for menorrhagia and dysmenorrhea  Counseling on above issues and coordination of care more than 50% for 20 minutes.  Princess Bruins MD, 12:21 PM 09/20/2018

## 2018-09-22 LAB — PAP, TP IMAGING W/ HPV RNA, RFLX HPV TYPE 16,18/45: HPV DNA High Risk: NOT DETECTED

## 2018-09-24 ENCOUNTER — Encounter: Payer: Self-pay | Admitting: Obstetrics & Gynecology

## 2018-09-24 NOTE — Patient Instructions (Signed)
1. Encounter for routine gynecological examination with Papanicolaou smear of cervix Normal gynecologic exam.  Pap test with high-risk HPV done.  Breast exam normal.  Patient will schedule a screening mammogram now.  We will follow-up for health labs.  Body mass index 32.19.  Recommend a lower calorie/carb diet such as [place] and increased physical activity with aerobic activities 5 times a week and weightlifting every 2 days. - CBC; Future - Comp Met (CMET); Future - TSH; Future - Lipid panel; Future - VITAMIN D 25 Hydroxy (Vit-D Deficiency, Fractures); Future  2. Encounter for initial prescription of contraceptive pills Started on norethindrone acetate ethinyl estradiol FE 24 1/20.  No contraindication to birth control pills.  Usage, risks and benefits reviewed with patient.  Prescription sent to pharmacy.  3. Menorrhagia with regular cycle Decision to start on birth control pill to control her heavy menstrual periods as well as for contraception.  We will follow-up with a pelvic ultrasound to evaluate the endometrial cavity and rule out pathology. - US Transvaginal Non-OB; Future - CBC; Future - TSH; Future  4. Screen for STD (sexually transmitted disease) Strict condom use recommended - Gono-Chlam on Pap - HIV antibody (with reflex); Future - RPR; Future - Hepatitis C Antibody; Future - Hepatitis B Surface AntiGEN; Future  Other orders - Norethindrone Acetate-Ethinyl Estrad-FE (LOESTRIN 24 FE) 1-20 MG-MCG(24) tablet; Take 1 tablet by mouth daily. Continuous use for menorrhagia and dysmenorrhea  Eyvonne Mechanic, it was a pleasure meeting you today!  I will inform you of your results as soon as they are available.

## 2018-09-25 ENCOUNTER — Other Ambulatory Visit: Payer: Self-pay

## 2018-09-26 ENCOUNTER — Other Ambulatory Visit: Payer: Self-pay

## 2018-09-26 DIAGNOSIS — Z01419 Encounter for gynecological examination (general) (routine) without abnormal findings: Secondary | ICD-10-CM

## 2018-09-26 DIAGNOSIS — N920 Excessive and frequent menstruation with regular cycle: Secondary | ICD-10-CM

## 2018-09-26 DIAGNOSIS — Z113 Encounter for screening for infections with a predominantly sexual mode of transmission: Secondary | ICD-10-CM

## 2018-10-01 LAB — COMPREHENSIVE METABOLIC PANEL
AG Ratio: 1.4 (calc) (ref 1.0–2.5)
ALT: 17 U/L (ref 6–29)
AST: 15 U/L (ref 10–30)
Albumin: 4.1 g/dL (ref 3.6–5.1)
Alkaline phosphatase (APISO): 48 U/L (ref 31–125)
BUN: 12 mg/dL (ref 7–25)
CO2: 24 mmol/L (ref 20–32)
Calcium: 9.5 mg/dL (ref 8.6–10.2)
Chloride: 103 mmol/L (ref 98–110)
Creat: 0.73 mg/dL (ref 0.50–1.10)
Globulin: 3 g/dL (calc) (ref 1.9–3.7)
Glucose, Bld: 119 mg/dL — ABNORMAL HIGH (ref 65–99)
Potassium: 4.8 mmol/L (ref 3.5–5.3)
Sodium: 136 mmol/L (ref 135–146)
Total Bilirubin: 0.3 mg/dL (ref 0.2–1.2)
Total Protein: 7.1 g/dL (ref 6.1–8.1)

## 2018-10-01 LAB — TEST AUTHORIZATION

## 2018-10-01 LAB — RPR: RPR Ser Ql: NONREACTIVE

## 2018-10-01 LAB — CBC
HCT: 37.4 % (ref 35.0–45.0)
Hemoglobin: 11.9 g/dL (ref 11.7–15.5)
MCH: 24.5 pg — ABNORMAL LOW (ref 27.0–33.0)
MCHC: 31.8 g/dL — ABNORMAL LOW (ref 32.0–36.0)
MCV: 77.1 fL — ABNORMAL LOW (ref 80.0–100.0)
MPV: 10.5 fL (ref 7.5–12.5)
Platelets: 345 10*3/uL (ref 140–400)
RBC: 4.85 10*6/uL (ref 3.80–5.10)
RDW: 16.5 % — ABNORMAL HIGH (ref 11.0–15.0)
WBC: 6.5 10*3/uL (ref 3.8–10.8)

## 2018-10-01 LAB — VITAMIN D 25 HYDROXY (VIT D DEFICIENCY, FRACTURES): Vit D, 25-Hydroxy: 19 ng/mL — ABNORMAL LOW (ref 30–100)

## 2018-10-01 LAB — TSH: TSH: 3.28 mIU/L

## 2018-10-01 LAB — LIPID PANEL
Cholesterol: 164 mg/dL (ref <200)
HDL: 69 mg/dL (ref >=50)
LDL Cholesterol (Calc): 76 mg/dL (calc)
Non-HDL Cholesterol (Calc): 95 mg/dL (calc) (ref <130)
Total CHOL/HDL Ratio: 2.4 (calc) (ref <5.0)
Triglycerides: 105 mg/dL (ref <150)

## 2018-10-01 LAB — HEPATITIS C ANTIBODY
Hepatitis C Ab: NONREACTIVE
SIGNAL TO CUT-OFF: 0.02 (ref <1.00)

## 2018-10-01 LAB — HEMOGLOBIN A1C W/OUT EAG: Hgb A1c MFr Bld: 6 % of total Hgb — ABNORMAL HIGH (ref <5.7)

## 2018-10-01 LAB — HIV ANTIBODY (ROUTINE TESTING W REFLEX): HIV 1&2 Ab, 4th Generation: NONREACTIVE

## 2018-10-01 LAB — HEPATITIS B SURFACE ANTIGEN: Hepatitis B Surface Ag: NONREACTIVE

## 2018-10-02 ENCOUNTER — Other Ambulatory Visit: Payer: Self-pay | Admitting: Obstetrics & Gynecology

## 2018-10-02 DIAGNOSIS — E559 Vitamin D deficiency, unspecified: Secondary | ICD-10-CM

## 2018-10-02 MED ORDER — VITAMIN D (ERGOCALCIFEROL) 1.25 MG (50000 UNIT) PO CAPS: 50000.0000 [IU] | ORAL_CAPSULE | ORAL | 0 refills | Status: DC

## 2018-10-19 ENCOUNTER — Other Ambulatory Visit: Payer: Self-pay

## 2018-10-22 ENCOUNTER — Ambulatory Visit: Payer: Self-pay | Admitting: Obstetrics & Gynecology

## 2018-10-22 ENCOUNTER — Other Ambulatory Visit: Payer: Self-pay

## 2018-11-07 ENCOUNTER — Other Ambulatory Visit: Payer: Self-pay

## 2018-11-07 ENCOUNTER — Encounter: Payer: Self-pay | Admitting: Obstetrics & Gynecology

## 2018-11-07 ENCOUNTER — Other Ambulatory Visit: Payer: Self-pay | Admitting: Obstetrics & Gynecology

## 2018-11-07 ENCOUNTER — Ambulatory Visit (INDEPENDENT_AMBULATORY_CARE_PROVIDER_SITE_OTHER): Payer: Self-pay

## 2018-11-07 ENCOUNTER — Ambulatory Visit (INDEPENDENT_AMBULATORY_CARE_PROVIDER_SITE_OTHER): Payer: Self-pay | Admitting: Obstetrics & Gynecology

## 2018-11-07 VITALS — BP 128/80

## 2018-11-07 DIAGNOSIS — D251 Intramural leiomyoma of uterus: Secondary | ICD-10-CM

## 2018-11-07 DIAGNOSIS — N920 Excessive and frequent menstruation with regular cycle: Secondary | ICD-10-CM

## 2018-11-07 DIAGNOSIS — Z3169 Encounter for other general counseling and advice on procreation: Secondary | ICD-10-CM

## 2018-11-07 NOTE — Progress Notes (Signed)
    [address] Jenna Mayo [birth year] [id number]        43 y.o.  G2P2L2 Divorced.  New boyfriend 23 yo, would like a child.  RP: Heavy menstrual periods for Pelvic US  HPI:  Stopped BCPs.  Advanced maternal age at 65.  Would like to conceive with new partner who is younger.  No pelvic pain.  Menstrual periods every month with moderate flow currently.  No breakthrough bleeding.   OB History  Gravida Para Term Preterm AB Living  2 2 2     2   SAB TAB Ectopic Multiple Live Births          2    # Outcome Date GA Lbr Len/2nd Weight Sex Delivery Anes PTL Lv  2 Term 2004 [redacted]w[redacted]d  8 lb (3.629 kg) F CS-Unspec   LIV  1 Term 2003 [redacted]w[redacted]d  7 lb 5 oz (3.317 kg)  CS-Unspec   LIV     Birth Comments: no complications    Past medical history,surgical history, problem list, medications, allergies, family history and social history were all reviewed and documented in the EPIC chart.   Directed ROS with pertinent positives and negatives documented in the history of present illness/assessment and plan.  Exam:  Vitals:   11/07/18 1237  BP: 128/80   General appearance:  Normal  Pelvic US today: T/V images.  Uterus anteverted heterogeneous measuring 9.81 x 6.26 x 4.71 cm, with an intramural fibroid posteriorly measuring 2.1 x 2.0 x 1.7 cm.  Endometrial lining tri-layered at 8.2 mm.  Right and left ovaries normal.  No apparent mass in the right or left adnexa.  No free fluid in the posterior cul-de-sac.   Assessment/Plan:  43 y.o. G2P2002   1. Menorrhagia with regular cycle Pelvic US findings reviewed with patient.  Uterus within normal except for a small intramural fibroids measuring 2.1 cm.  Normal endometrial lining.  Normal ovaries.  Patient reassured.    2. Encounter for preconception consultation AMA 43 yo, with increased risk of infertility, risk of miscarriage, risk of abnormal chromosomes.   Recommend PNVs.  Timed IC.  If not successful at 6 months, recommend investigation and  referral to a Fertility specialist.  Counseling on above issues and coordination of care more than 50% for 15 minutes.  Princess Bruins MD, 12:55 PM 11/07/2018

## 2018-11-15 ENCOUNTER — Encounter: Payer: Self-pay | Admitting: Obstetrics & Gynecology

## 2018-11-15 NOTE — Patient Instructions (Signed)
  1. Menorrhagia with regular cycle Pelvic US findings reviewed with patient.  Uterus within normal except for a small intramural fibroids measuring 2.1 cm.  Normal endometrial lining.  Normal ovaries.  Patient reassured.    2. Encounter for preconception consultation AMA 43 yo, with increased risk of infertility, risk of miscarriage, risk of abnormal chromosomes.   Recommend PNVs.  Timed IC.  If not successful at 6 months, recommend investigation and referral to a Fertility specialist.  Jenna Mayo, it was a pleasure seeing you today!

## 2018-12-31 ENCOUNTER — Telehealth: Payer: Self-pay

## 2018-12-31 NOTE — Telephone Encounter (Signed)
Patient had d/c'd bc pills to try to conceive but she called today stating she would like to go back on the pill. Ok to refill?  (previously on Loestrin 1-20.)

## 2018-12-31 NOTE — Telephone Encounter (Signed)
Yes, can refill BCP.

## 2019-01-01 MED ORDER — NORETHIN ACE-ETH ESTRAD-FE 1-20 MG-MCG PO TABS: 1.0000 | ORAL_TABLET | Freq: Every day | ORAL | 2 refills | Status: DC

## 2019-01-01 NOTE — Telephone Encounter (Signed)
Spoke with patient. Rx sent. Advised wait of menses to start.

## 2019-01-03 ENCOUNTER — Other Ambulatory Visit: Payer: Self-pay | Admitting: Obstetrics & Gynecology

## 2019-01-03 DIAGNOSIS — E559 Vitamin D deficiency, unspecified: Secondary | ICD-10-CM

## 2019-10-07 ENCOUNTER — Other Ambulatory Visit: Payer: Self-pay

## 2019-10-08 ENCOUNTER — Ambulatory Visit (INDEPENDENT_AMBULATORY_CARE_PROVIDER_SITE_OTHER): Payer: Self-pay | Admitting: Obstetrics & Gynecology

## 2019-10-08 ENCOUNTER — Encounter: Payer: Self-pay | Admitting: Obstetrics & Gynecology

## 2019-10-08 VITALS — BP 126/82

## 2019-10-08 DIAGNOSIS — N979 Female infertility, unspecified: Secondary | ICD-10-CM

## 2019-10-08 LAB — TIQ- AMBIGUOUS ORDER

## 2019-10-12 LAB — TEST AUTHORIZATION

## 2019-10-12 LAB — ANTI-MULLERIAN HORMONE (AMH), FEMALE: Anti-Mullerian Hormones(AMH), Female: 0.33 ng/mL (ref 0.01–2.99)

## 2019-10-13 ENCOUNTER — Encounter: Payer: Self-pay | Admitting: Obstetrics & Gynecology

## 2019-10-13 NOTE — Patient Instructions (Signed)
  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  Advance maternal age at 66.  Would like to conceive with a new partner.  Will check her AMH level today.  Refer to fertility specialist to consider IVF versus egg donor.  Recommend a sperm analysis, but patient prefers to wait on that. - Anti mullerian hormone  Other orders - ferrous sulfate 325 (65 FE) MG EC tablet; Take 325 mg by mouth 3 (three) times daily with meals. - Cyanocobalamin (B-12 COMPLIANCE INJECTION IJ); Inject as directed. - TEST IN QUESTION AMBIGUOUS ORDER - Anti-Mullerian Hormone [facility]), Female - TEST AUTHORIZATION  Eyvonne Mechanic, it was a pleasure seeing you today!  I will inform you of your results as soon as they are available.

## 2019-10-13 NOTE — Progress Notes (Signed)
    [address] Sherwood Gambler [place] 30-May-1975 [id number]        44 y.o.  G2P2L2 Divorced.  Partner 44 yo.  RP: Secondary infertility  HPI:   No changes since July 2020.  Menstrual periods normal and regular every month.  No breakthrough bleeding.  Stopped BCPs more than a year ago.  Advanced maternal age at 72.  Would like to conceive with 25 yo partner.  No pelvic pain.    Previous pregnancies x2 without difficulty.  Vaginal deliveries at the arm.  No history of STI.    OB History  Gravida Para Term Preterm AB Living  2 2 2     2   SAB TAB Ectopic Multiple Live Births          2    # Outcome Date GA Lbr Len/2nd Weight Sex Delivery Anes PTL Lv  2 Term 2004 [redacted]w[redacted]d  8 lb (3.629 kg) F CS-Unspec   LIV  1 Term 2003 [redacted]w[redacted]d  7 lb 5 oz (3.317 kg)  CS-Unspec   LIV     Birth Comments: no complications    Past medical history,surgical history, problem list, medications, allergies, family history and social history were all reviewed and documented in the EPIC chart.   Directed ROS with pertinent positives and negatives documented in the history of present illness/assessment and plan.  Exam:  Vitals:   10/08/19 1410  BP: 126/82   General appearance:  Normal  Gynecologic exam deferred.   Assessment/Plan:  45 y.o. 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  Advance maternal age at 42.  Would like to conceive with a new partner.  Will check her AMH level today.  Refer to fertility specialist to consider IVF versus egg donor.  Recommend a sperm analysis, but patient prefers to wait on that. - Anti mullerian hormone  Other orders - ferrous sulfate 325 (65 FE) MG EC tablet; Take 325 mg by mouth 3 (three) times daily with meals. - Cyanocobalamin (B-12 COMPLIANCE INJECTION IJ); Inject as directed. - TEST IN QUESTION AMBIGUOUS ORDER - Anti-Mullerian Hormone [facility]), Female - TEST AUTHORIZATION  Princess Bruins MD, 8:45 AM 10/13/2019

## 2019-10-14 ENCOUNTER — Telehealth: Payer: Self-pay | Admitting: *Deleted

## 2019-10-14 NOTE — Telephone Encounter (Signed)
Referral faxed to [facility] they will call to schedule.  

## 2019-10-14 NOTE — Telephone Encounter (Signed)
-----   Message from Princess Bruins, MD sent at 10/08/2019  5:04 PM EDT ----- Regarding: Refer to Fertility specialist 44 yo G2P2L2 new relationship.  Secondary infertility.  AMH pending.

## 2021-12-15 ENCOUNTER — Other Ambulatory Visit: Payer: Self-pay | Admitting: *Deleted

## 2021-12-15 DIAGNOSIS — Z3169 Encounter for other general counseling and advice on procreation: Secondary | ICD-10-CM

## 2021-12-15 DIAGNOSIS — R638 Other symptoms and signs concerning food and fluid intake: Secondary | ICD-10-CM

## 2021-12-15 DIAGNOSIS — Z98891 History of uterine scar from previous surgery: Secondary | ICD-10-CM

## 2021-12-28 ENCOUNTER — Ambulatory Visit: Payer: No Typology Code available for payment source | Attending: Internal Medicine

## 2021-12-28 DIAGNOSIS — Z3169 Encounter for other general counseling and advice on procreation: Secondary | ICD-10-CM

## 2021-12-28 DIAGNOSIS — Z98891 History of uterine scar from previous surgery: Secondary | ICD-10-CM

## 2021-12-28 DIAGNOSIS — R638 Other symptoms and signs concerning food and fluid intake: Secondary | ICD-10-CM

## 2021-12-29 LAB — EXERCISE TOLERANCE TEST
Estimated workload: 10.1
Exercise duration (min): 9 min
Exercise duration (sec): 0 s
MPHR: 174 {beats}/min
Peak HR: 169 {beats}/min
Percent HR: 97 %
Rest HR: 80 {beats}/min
ST Depression (mm): 0 mm

## 2022-03-18 ENCOUNTER — Encounter (HOSPITAL_COMMUNITY): Payer: Self-pay | Admitting: *Deleted

## 2022-03-18 ENCOUNTER — Inpatient Hospital Stay (HOSPITAL_COMMUNITY)
Admission: AD | Admit: 2022-03-18 | Discharge: 2022-03-18 | Disposition: A | Discharge status: Home / Self Care | Payer: Self-pay | Attending: Obstetrics and Gynecology | Admitting: Obstetrics and Gynecology

## 2022-03-18 ENCOUNTER — Inpatient Hospital Stay (HOSPITAL_COMMUNITY): Payer: Self-pay

## 2022-03-18 DIAGNOSIS — O209 Hemorrhage in early pregnancy, unspecified: Secondary | ICD-10-CM

## 2022-03-18 DIAGNOSIS — Z3A09 9 weeks gestation of pregnancy: Secondary | ICD-10-CM | POA: Insufficient documentation

## 2022-03-18 DIAGNOSIS — O4691 Antepartum hemorrhage, unspecified, first trimester: Secondary | ICD-10-CM | POA: Insufficient documentation

## 2022-03-18 HISTORY — DX: Other specified health status: Z78.9

## 2022-03-18 LAB — WET PREP, GENITAL
Clue Cells Wet Prep HPF POC: NONE SEEN
Sperm: NONE SEEN
Trich, Wet Prep: NONE SEEN
WBC, Wet Prep HPF POC: <10 (ref <10)
Yeast Wet Prep HPF POC: NONE SEEN

## 2022-03-18 LAB — ABO/RH: ABO/RH(D): A POS

## 2022-03-18 LAB — POCT PREGNANCY, URINE: Preg Test, Ur: POSITIVE — AB

## 2022-03-18 LAB — CBC
HCT: 34.9 % — ABNORMAL LOW (ref 36.0–46.0)
Hemoglobin: 11.8 g/dL — ABNORMAL LOW (ref 12.0–15.0)
MCH: 28.4 pg (ref 26.0–34.0)
MCHC: 33.8 g/dL (ref 30.0–36.0)
MCV: 84.1 fL (ref 80.0–100.0)
Platelets: 282 10*3/uL (ref 150–400)
RBC: 4.15 MIL/uL (ref 3.87–5.11)
RDW: 14.9 % (ref 11.5–15.5)
WBC: 8.8 10*3/uL (ref 4.0–10.5)
nRBC: 0 % (ref 0.0–0.2)

## 2022-03-18 NOTE — MAU Note (Signed)
.  Jenna Mayo is a 46 y.o. at Unknown here in MAU reporting: started having brown spotting this morning with wiping . Stopped in the afternoon. Tonight about 30 min ago she went to BR and it was bright red and a lot of blood. Reports mild cramping now.   Last Intercourse 2 days ago. Had positive pregnancy test and U/S at [facility].  LMP: 01/14/22 Onset of complaint: this morning.  Pain score: 3 Vitals:   03/18/22 2213  BP: 114/65  Pulse: 94  Resp: 18  Temp: 98.3 F (36.8 C)     FHT: Lab orders placed from triage:  UPT

## 2022-03-18 NOTE — MAU Provider Note (Signed)
History     CSN: [id number]  Arrival date and time: 03/18/22 2156   Event Date/Time   First Provider Initiated Contact with Patient 03/18/22 2232      Chief Complaint  Patient presents with   Vaginal Bleeding   Jenna Mayo is a 46 y.o. G3P2002 at 5w2dwho has not established care.  She presents today for Vaginal Bleeding.  She reports having infertility treatments and was confirmed pregnant around Oct 12th.  She reports having an UKorealast week with SIUP with HR.  She states she has been having brown discharge that turned into bright red bleeding.  She states she is passing dime sized clots and is having some mild (2/10) cramping.  She denies issues with urination or diarrhea/constipation.  She does report intercourse 2 days ago.       OB History     Gravida  3   Para  2   Term  2   Preterm      AB  0   Living  2      SAB  0   IAB  0   Ectopic  0   Multiple      Live Births  2           Past Medical History:  Diagnosis Date   Allergy     Past Surgical History:  Procedure Laterality Date   CESAREAN SECTION     x2    Family History  Problem Relation Age of Onset   Diabetes Mother    Diabetes Father    Diabetes Brother    Diabetes Brother     Social History   Tobacco Use   Smoking status: Never   Smokeless tobacco: Never  Vaping Use   Vaping Use: Never used  Substance Use Topics   Alcohol use: No   Drug use: No    Allergies: No Known Allergies  Medications Prior to Admission  Medication Sig Dispense Refill Last Dose   Cyanocobalamin (B-12 COMPLIANCE INJECTION IJ) Inject as directed.      ferrous sulfate 325 (65 FE) MG EC tablet Take 325 mg by mouth 3 (three) times daily with meals.       Review of Systems  Constitutional:  Negative for chills and fever.  Gastrointestinal:  Positive for nausea. Negative for abdominal pain and vomiting.  Genitourinary:  Positive for vaginal bleeding and vaginal discharge  (Ow[place] Sharkprior to bleeding). Negative for difficulty urinating, dysuria and pelvic pain.   Physical Exam   Blood pressure 114/65, pulse 94, temperature 98.3 F (36.8 C), resp. rate 18, last menstrual period 01/14/2022.  Physical Exam Vitals reviewed.  Constitutional:      General: She is not in acute distress.    Appearance: Normal appearance.     Comments: Tearful  HENT:     Head: Normocephalic and atraumatic.  Eyes:     Conjunctiva/sclera: Conjunctivae normal.  Cardiovascular:     Rate and Rhythm: Normal rate.  Pulmonary:     Effort: Pulmonary effort is normal. No respiratory distress.  Abdominal:     Palpations: Abdomen is soft.     Tenderness: There is no abdominal tenderness.  Musculoskeletal:        General: Normal range of motion.     Cervical back: Normal range of motion.  Skin:    General: Skin is warm and dry.  Neurological:     Mental Status: She is alert and oriented to person, place, and time.  Psychiatric:        Mood and Affect: Mood normal.        Behavior: Behavior normal.     MAU Course  Procedures Results for orders placed or performed during the hospital encounter of 03/18/22 (from the past 24 hour(s))  Pregnancy, urine POC     Status: Abnormal   Collection Time: 03/18/22 10:22 PM  Result Value Ref Range   Preg Test, Ur POSITIVE (A) NEGATIVE  Wet prep, genital     Status: None   Collection Time: 03/18/22 10:42 PM  Result Value Ref Range   Yeast Wet Prep HPF POC NONE SEEN NONE SEEN   Trich, Wet Prep NONE SEEN NONE SEEN   Clue Cells Wet Prep HPF POC NONE SEEN NONE SEEN   WBC, Wet Prep HPF POC <10 <10   Sperm NONE SEEN   CBC     Status: Abnormal   Collection Time: 03/18/22 10:46 PM  Result Value Ref Range   WBC 8.8 4.0 - 10.5 K/uL   RBC 4.15 3.87 - 5.11 MIL/uL   Hemoglobin 11.8 (L) 12.0 - 15.0 g/dL   HCT 34.9 (L) 36.0 - 46.0 %   MCV 84.1 80.0 - 100.0 fL   MCH 28.4 26.0 - 34.0 pg   MCHC 33.8 30.0 - 36.0 g/dL   RDW 14.9 11.5 - 15.5 %    Platelets 282 150 - 400 K/uL   nRBC 0.0 0.0 - 0.2 %  ABO/Rh     Status: None   Collection Time: 03/18/22 10:46 PM  Result Value Ref Range   ABO/RH(D) A POS    No rh immune globuloin      NOT A RH IMMUNE GLOBULIN CANDIDATE, PT RH POSITIVE Performed at [facility], [place] [address] [place], [place] [address]    US OB LESS THAN 14 WEEKS WITH [place] TRANSVAGINAL  Result Date: 03/18/2022 CLINICAL DATA:  Vaginal bleeding during pregnancy. EXAM: OBSTETRIC <14 WK [place] AND TRANSVAGINAL OB US TECHNIQUE: Both transabdominal and transvaginal ultrasound examinations were performed for complete evaluation of the gestation as well as the maternal uterus, adnexal regions, and pelvic cul-de-sac. Transvaginal technique was performed to assess early pregnancy. COMPARISON:  None Available. FINDINGS: Intrauterine gestational sac: Single.  Appropriate position. Yolk sac:  Visualized. Embryo:  Visualized. Cardiac Activity: Visualized. Heart Rate: 173 bpm CRL:  23.9 mm   9 w   1 d                  [place] EDC: 10/20/2022 Subchorionic hemorrhage:  None visualized. Maternal uterus/adnexae: The bilateral ovaries appear within normal limits. There is no free fluid. There is a nonvascular echogenic structure in the region of the cervix measuring 7.5 x 5.7 by 6.6 cm. This is nonvascular. IMPRESSION: 1. Single live intrauterine gestation measuring 9 weeks 1 day. 2. Large nonvascular echogenic structure in the region of the cervix measuring up to 7.5 cm, indeterminate. This may represent a large clot or endocervical mass. Gynecology consultation recommended. Electronically Signed   By: Ronney Asters M.D.   On: 03/18/2022 23:27    MDM Self swab: Wet Prep and GC/CT Labs: UA, UPT, CBC, hCG, ABO Ultrasound Assessment and Plan  47 year old, G3P2002  SIUP at 9.2 weeks Vaginal Bleeding  -Reviewed POC with patient. -Patient to self swab and collect cultures.  -Labs ordered -Will send for [place].  -Await results.    Jenna Mayo 03/18/2022, 10:32 PM   Reassessment (11:39 PM)  -Results as  above. -Patient informed that live IUP identified.  Patient grateful and tearful. -Informed of cervical mass suspicious for large clot.  -Bleeding precautions reviewed. -Discussed pelvic rest. Instructed to continue progesterone suppositories. -Return precautions reviewed. -Encouraged to start [facility]. -Encouraged to call primary office or return to MAU if symptoms worsen or with the onset of new symptoms. -Discharged to home in stable condition.  Jenna Conners MSN, CNM Advanced Practice Provider, [facility]

## 2022-03-19 LAB — HCG, QUANTITATIVE, PREGNANCY: hCG, Beta Chain, Quant, S: 99753 m[IU]/mL — ABNORMAL HIGH (ref <5)

## 2022-03-21 LAB — GC/CHLAMYDIA PROBE AMP (~~LOC~~) NOT AT ARMC
Chlamydia: NEGATIVE
Comment: NEGATIVE
Comment: NORMAL
Neisseria Gonorrhea: NEGATIVE

## 2022-04-11 ENCOUNTER — Ambulatory Visit (INDEPENDENT_AMBULATORY_CARE_PROVIDER_SITE_OTHER): Payer: Self-pay | Admitting: Family Medicine

## 2022-04-11 ENCOUNTER — Encounter: Payer: Self-pay | Admitting: Family Medicine

## 2022-04-11 VITALS — BP 139/81 | HR 86 | Wt 154.0 lb

## 2022-04-11 DIAGNOSIS — O0990 Supervision of high risk pregnancy, unspecified, unspecified trimester: Secondary | ICD-10-CM | POA: Insufficient documentation

## 2022-04-11 DIAGNOSIS — O34219 Maternal care for unspecified type scar from previous cesarean delivery: Secondary | ICD-10-CM | POA: Insufficient documentation

## 2022-04-11 DIAGNOSIS — O09811 Supervision of pregnancy resulting from assisted reproductive technology, first trimester: Secondary | ICD-10-CM

## 2022-04-11 DIAGNOSIS — R7303 Prediabetes: Secondary | ICD-10-CM

## 2022-04-11 DIAGNOSIS — O209 Hemorrhage in early pregnancy, unspecified: Secondary | ICD-10-CM

## 2022-04-11 DIAGNOSIS — Z3A12 12 weeks gestation of pregnancy: Secondary | ICD-10-CM

## 2022-04-11 DIAGNOSIS — O09819 Supervision of pregnancy resulting from assisted reproductive technology, unspecified trimester: Secondary | ICD-10-CM | POA: Insufficient documentation

## 2022-04-11 DIAGNOSIS — O0991 Supervision of high risk pregnancy, unspecified, first trimester: Secondary | ICD-10-CM

## 2022-04-11 MED ORDER — ASPIRIN 81 MG PO TBEC: 81.0000 mg | DELAYED_RELEASE_TABLET | Freq: Every day | ORAL | 2 refills | Status: DC

## 2022-04-11 NOTE — Progress Notes (Signed)
INITIAL PRENATAL VISIT  Subjective:   Jenna Mayo is being seen today for her first obstetrical visit.  This is a planned pregnancy. This is a desired pregnancy.  She is at 12w4dgestation by LMP/IVF Her obstetrical history is significant for advanced maternal age and CSx2 . Relationship with FOB: spouse, living together. Patient does intend to breast feed. Pregnancy history fully reviewed.  Patient reports  seen at MAU for vaginal bleeding .  Indications for ASA therapy (per uptodate) One of the following: Previous pregnancy with preeclampsia, especially early onset and with an adverse outcome No Multifetal gestation No Chronic hypertension No Type 1 or 2 diabetes mellitus No Chronic kidney disease No Autoimmune disease (antiphospholipid syndrome, systemic lupus erythematosus) No  Two or more of the following: Nulliparity No Obesity (body mass index >30 kg/m2) Yes Family history of preeclampsia in mother or sister No Age ?330years Yes Sociodemographic characteristics (African American race, low socioeconomic level) No Personal risk factors (eg, previous pregnancy with low birth weight or small for gestational age infant, previous adverse pregnancy outcome [eg, stillbirth], interval >10 years between pregnancies) Yes  Indications for early GDM screening  First-degree relative with diabetes Yes BMI >30kg/m2 Yes Age > 25 Yes Previous birth of an infant weighing ?4000 g No Gestational diabetes mellitus in a previous pregnancy No Glycated hemoglobin ?5.7 percent (39 mmol/mol), impaired glucose tolerance, or impaired fasting glucose on previous testing No High-risk race/ethnicity (eg, African American, Latino, Native American, Asian American, Pacific Islander) Yes Previous stillbirth of unknown cause No Maternal birthweight > 9 lbs No History of cardiovascular disease No Hypertension or on therapy for hypertension No High-density lipoprotein cholesterol level  <35 mg/dL (0.90 mmol/L) and/or a triglyceride level >250 mg/dL (2.82 mmol/L) No Polycystic ovary syndrome No Physical inactivity No Other clinical condition associated with insulin resistance (eg, severe obesity, acanthosis nigricans) No Current use of glucocorticoids No   Early screening tests: FBS, A1C, Random CBG, glucose challenge   Review of Systems:   Review of Systems  Objective:    Obstetric History OB History  Gravida Para Term Preterm AB Living  '3 2 2   '$ 0 2  SAB IAB Ectopic Multiple Live Births  0 0 0   2    # Outcome Date GA Lbr Len/2nd Weight Sex Delivery Anes PTL Lv  3 Current           2 Term 2004 417w0d8 lb (3.629 kg) F CS-Unspec   LIV  1 Term 2003 4013w0d lb 5 oz (3.317 kg)  CS-Unspec   LIV     Birth Comments: no complications    Past Medical History:  Diagnosis Date   Allergy    Medical history non-contributory     Past Surgical History:  Procedure Laterality Date   CESAREAN SECTION     x2    Current Outpatient Medications on File Prior to Visit  Medication Sig Dispense Refill   Cyanocobalamin (B-12 COMPLIANCE INJECTION IJ) Inject as directed.     ferrous sulfate 325 (65 FE) MG EC tablet Take 325 mg by mouth 3 (three) times daily with meals.     Prenatal Vit-Fe Fumarate-FA (PRENATAL MULTIVITAMIN) TABS tablet Take 1 tablet by mouth daily at 12 noon.     No current facility-administered medications on file prior to visit.    No Known Allergies  Social History:  reports that she has never smoked. She has never used smokeless tobacco. She reports that she  does not drink alcohol and does not use drugs.  Family History  Problem Relation Age of Onset   Diabetes Mother    Diabetes Father    Diabetes Brother    Diabetes Brother     The following portions of the patient's history were reviewed and updated as appropriate: allergies, current medications, past family history, past medical history, past social history, past surgical history and  problem list.  Review of Systems Review of Systems    Physical Exam:  BP 139/81   Pulse 86   Wt 154 lb (69.9 kg)   LMP 01/14/2022   BMI 32.19 kg/m  CONSTITUTIONAL: Well-developed, well-nourished female in no acute distress.  HENT:  Normocephalic, atraumatic, External right and left ear normal. Oropharynx is clear and moist EYES: Conjunctivae normal. No scleral icterus.  NECK: Normal range of motion, supple, no masses.  Normal thyroid.  SKIN: Skin is warm and dry. No rash noted. Not diaphoretic. No erythema. No pallor. MUSCULOSKELETAL: Normal range of motion. No tenderness.  No cyanosis, clubbing, or edema.   NEUROLOGIC: Alert and oriented to person, place, and time. Normal muscle tone coordination.  PSYCHIATRIC: Normal mood and affect. Normal behavior. Normal judgment and thought content. CARDIOVASCULAR: Normal heart rate noted, regular rhythm RESPIRATORY: Clear to auscultation bilaterally. Effort and breath sounds normal, no problems with respiration noted. BREASTS: Symmetric in size. No masses, skin changes, nipple drainage, or lymphadenopathy. ABDOMEN: Soft, normal bowel sounds, no distention noted.  No tenderness, rebound or guarding. Fundal ht: 12 PELVIC: deferred. FHR: 160   Assessment:    Pregnancy: G1P0000 1. Supervision of high risk pregnancy, antepartum - CBC/D/Plt+RPR+Rh+ABO+RubIgG... - Culture, OB Urine - Hemoglobin A1c - [place] MFM OB DETAIL +14 WK; Future  2. Pregnancy resulting from in vitro fertilization in first trimester - [place] MFM OB DETAIL +14 WK; Future  3. Previous cesarean section complicating pregnancy - [place] MFM OB DETAIL +14 WK; Future     Plan:     Initial labs drawn. Prenatal vitamins. Problem list reviewed and updated. Reviewed in detail the nature of the practice with collaborative care between  Genetic screening discussed: NIPS/First trimester screen/Quad/AFP ordered. Role of ultrasound in pregnancy discussed; Anatomy US:  ordered. Amniocentesis discussed: not indicated. Follow up in 4 weeks. Discussed clinic routines, schedule of care and testing, genetic screening options, involvement of students and residents under the direct supervision of APPs and doctors and presence of female providers. Pt verbalized understanding.   Caren Macadam, MD 04/11/2022 3:19 PM

## 2022-04-12 DIAGNOSIS — R7303 Prediabetes: Secondary | ICD-10-CM | POA: Insufficient documentation

## 2022-04-12 LAB — HCV INTERPRETATION

## 2022-04-12 LAB — CBC/D/PLT+RPR+RH+ABO+RUBIGG...
Antibody Screen: NEGATIVE
Basophils Absolute: 0 10*3/uL (ref 0.0–0.2)
Basos: 0 %
EOS (ABSOLUTE): 0.2 10*3/uL (ref 0.0–0.4)
Eos: 3 %
HCV Ab: NONREACTIVE
HIV Screen 4th Generation wRfx: NONREACTIVE
Hematocrit: 37.4 % (ref 34.0–46.6)
Hemoglobin: 12.2 g/dL (ref 11.1–15.9)
Hepatitis B Surface Ag: NEGATIVE
Immature Grans (Abs): 0 10*3/uL (ref 0.0–0.1)
Immature Granulocytes: 0 %
Lymphocytes Absolute: 2.6 10*3/uL (ref 0.7–3.1)
Lymphs: 28 %
MCH: 27.7 pg (ref 26.6–33.0)
MCHC: 32.6 g/dL (ref 31.5–35.7)
MCV: 85 fL (ref 79–97)
Monocytes Absolute: 0.7 10*3/uL (ref 0.1–0.9)
Monocytes: 7 %
Neutrophils Absolute: 5.9 10*3/uL (ref 1.4–7.0)
Neutrophils: 62 %
Platelets: 346 10*3/uL (ref 150–450)
RBC: 4.4 x10E6/uL (ref 3.77–5.28)
RDW: 14 % (ref 11.7–15.4)
RPR Ser Ql: NONREACTIVE
Rh Factor: POSITIVE
Rubella Antibodies, IGG: 11 index (ref >0.99)
WBC: 9.4 10*3/uL (ref 3.4–10.8)

## 2022-04-12 LAB — HEMOGLOBIN A1C
Est. average glucose Bld gHb Est-mCnc: 123 mg/dL
Hgb A1c MFr Bld: 5.9 % — ABNORMAL HIGH (ref 4.8–5.6)

## 2022-04-13 LAB — URINE CULTURE, OB REFLEX: Organism ID, Bacteria: NO GROWTH

## 2022-04-13 LAB — CULTURE, OB URINE

## 2022-04-19 ENCOUNTER — Other Ambulatory Visit: Payer: No Typology Code available for payment source

## 2022-04-19 DIAGNOSIS — R7303 Prediabetes: Secondary | ICD-10-CM

## 2022-04-20 LAB — GLUCOSE TOLERANCE, 2 HOURS W/ 1HR
Glucose, 1 hour: 210 mg/dL — ABNORMAL HIGH (ref 70–179)
Glucose, 2 hour: 194 mg/dL — ABNORMAL HIGH (ref 70–152)
Glucose, Fasting: 98 mg/dL — ABNORMAL HIGH (ref 70–91)

## 2022-04-27 ENCOUNTER — Telehealth: Payer: Self-pay | Admitting: *Deleted

## 2022-04-27 ENCOUNTER — Other Ambulatory Visit: Payer: Self-pay | Admitting: *Deleted

## 2022-04-27 DIAGNOSIS — O24419 Gestational diabetes mellitus in pregnancy, unspecified control: Secondary | ICD-10-CM

## 2022-04-27 MED ORDER — ACCU-CHEK GUIDE W/DEVICE KIT: 1.0000 | PACK | Freq: Four times a day (QID) | 0 refills | Status: DC

## 2022-04-27 MED ORDER — ACCU-CHEK SOFTCLIX LANCETS MISC: 1.0000 | Freq: Four times a day (QID) | 12 refills | Status: DC

## 2022-04-27 MED ORDER — ACCU-CHEK GUIDE VI STRP: ORAL_STRIP | 12 refills | Status: DC

## 2022-04-27 NOTE — Telephone Encounter (Signed)
-----   Message from Radene Gunning, MD sent at 04/22/2022  8:31 AM EST ----- Pt needs diabetes testing supplies and appt with educator please - she has very clear at least GDM if not T2DM based on her being 14 weeks.  Thanks, pad

## 2022-04-27 NOTE — Telephone Encounter (Signed)
Called pt to discuss new GDM diagnosis. Informed that we will send in supplies to pharmacy to be able to check blood sugars. We will also send a referral in to Diabetes and Nutrition to better learn how to manage GDM. Discussed with patient that once they take the class they can then start checking blood sugars four times a day: fasting, and 2hr after each meal. Informed of the normal ranges we would like to see the blood sugars and to make sure they are writing them down or keeping a record of them to bring into each appointment so they can go over them with provider. Pt verbalizes and understands, to call or MyChart message [place] if any questions.

## 2022-05-10 ENCOUNTER — Encounter: Payer: No Typology Code available for payment source | Admitting: Obstetrics and Gynecology

## 2022-05-11 ENCOUNTER — Encounter: Payer: Self-pay | Admitting: Registered"

## 2022-05-11 ENCOUNTER — Ambulatory Visit (INDEPENDENT_AMBULATORY_CARE_PROVIDER_SITE_OTHER): Payer: Self-pay | Admitting: Family Medicine

## 2022-05-11 ENCOUNTER — Encounter: Payer: No Typology Code available for payment source | Attending: Obstetrics and Gynecology | Admitting: Registered"

## 2022-05-11 VITALS — BP 118/77 | HR 84 | Wt 154.0 lb

## 2022-05-11 DIAGNOSIS — O09819 Supervision of pregnancy resulting from assisted reproductive technology, unspecified trimester: Secondary | ICD-10-CM

## 2022-05-11 DIAGNOSIS — O24912 Unspecified diabetes mellitus in pregnancy, second trimester: Secondary | ICD-10-CM | POA: Insufficient documentation

## 2022-05-11 DIAGNOSIS — O24410 Gestational diabetes mellitus in pregnancy, diet controlled: Secondary | ICD-10-CM

## 2022-05-11 DIAGNOSIS — Z3A16 16 weeks gestation of pregnancy: Secondary | ICD-10-CM

## 2022-05-11 DIAGNOSIS — O0990 Supervision of high risk pregnancy, unspecified, unspecified trimester: Secondary | ICD-10-CM

## 2022-05-11 DIAGNOSIS — Z8632 Personal history of gestational diabetes: Secondary | ICD-10-CM | POA: Insufficient documentation

## 2022-05-11 DIAGNOSIS — O34219 Maternal care for unspecified type scar from previous cesarean delivery: Secondary | ICD-10-CM

## 2022-05-11 DIAGNOSIS — O24919 Unspecified diabetes mellitus in pregnancy, unspecified trimester: Secondary | ICD-10-CM | POA: Insufficient documentation

## 2022-05-11 DIAGNOSIS — O09812 Supervision of pregnancy resulting from assisted reproductive technology, second trimester: Secondary | ICD-10-CM

## 2022-05-11 DIAGNOSIS — O0992 Supervision of high risk pregnancy, unspecified, second trimester: Secondary | ICD-10-CM

## 2022-05-11 NOTE — Progress Notes (Signed)
Pt states she has changed diet since checking blood sugar and now meals are pretty well controlled, but pt states her fasting BG still is elevated. Since Patient is not yet reached 3rd trimester she would probably benefit from having a follow-up visit with diabetes educator at the [facility].  The following learning objectives were met by the patient during this course:   States the definition of Gestational Diabetes States why dietary management is important in controlling blood glucose Describes the effects each nutrient has on blood glucose levels Demonstrates ability to create a balanced meal plan Demonstrates carbohydrate counting  States when to check blood glucose levels Demonstrates proper blood glucose monitoring techniques States the effect of stress and exercise on blood glucose levels States the importance of limiting caffeine and abstaining from alcohol and smoking   Blood glucose monitor given: None. Patient has meter and checking blood sugar prior to class.   Patient instructed to monitor glucose levels: FBS: 60 - <95; 1 hour: <140; 2 hour: <120   Patient received handouts: Nutrition Diabetes and Pregnancy, including carb counting list Glucose log sheet   Patient will be seen for follow-up as needed.

## 2022-05-11 NOTE — Progress Notes (Deleted)
Has CBG log [facility] 108-136 2 hour pp 88-145

## 2022-05-11 NOTE — Progress Notes (Signed)
   PRENATAL VISIT NOTE  Subjective:  Jenna Mayo is a 47 y.o. G3P2002 at 60w6dbeing seen today for ongoing prenatal care.  She is currently monitored for the following issues for this high-risk pregnancy and has Supervision of high risk pregnancy, antepartum; Previous cesarean section complicating pregnancy; Pregnancy resulting from in vitro fertilization, antepartum; Prediabetes; and Diabetes mellitus during pregnancy in second trimester on their problem list.  Patient reports no complaints.  Contractions: Not present. Vag. Bleeding: None.   . Denies leaking of fluid.   The following portions of the patient's history were reviewed and updated as appropriate: allergies, current medications, past family history, past medical history, past social history, past surgical history and problem list.   Objective:   Vitals:   05/11/22 1616  BP: 118/77  Pulse: 84  Weight: 154 lb (69.9 kg)    Fetal Status: Fetal Heart Rate (bpm): 148         General:  Alert, oriented and cooperative. Patient is in no acute distress.  Skin: Skin is warm and dry. No rash noted.   Cardiovascular: Normal heart rate noted  Respiratory: Normal respiratory effort, no problems with respiration noted  Abdomen: Soft, gravid, appropriate for gestational age.  Pain/Pressure: Absent     Pelvic: Cervical exam deferred        Extremities: Normal range of motion.     Mental Status: Normal mood and affect. Normal behavior. Normal judgment and thought content.   Assessment and Plan:  Pregnancy: G3P2002 at 132w6d. Diet controlled gestational diabetes mellitus (GDM) in second trimester Has CBG log FBG 108-136 2 hour pp 88-145 Class today--3 meals, 3 snacks, protein to bedtime snack to help get this in line. Add walking 10 mins, 3x/day. May need metformin q hs, if remain elevated.  2. Pregnancy resulting from in vitro fertilization, antepartum Donr egg with pre-implantation genetics.  3. Previous  cesarean section complicating pregnancy Will need RCS  4. Supervision of high risk pregnancy, antepartum   General obstetric precautions including but not limited to vaginal bleeding, contractions, leaking of fluid and fetal movement were reviewed in detail with the patient. Please refer to After Visit Summary for other counseling recommendations.   Return in 4 weeks (on 06/08/2022).  Future Appointments  Date Time Provider [place]2/10/2022  2:30 PM PiAletha HalimMD [facility]  07/05/2022  3:30 PM Anyanwu, UgSallyanne HaversMD [facility]    TaDonnamae JudeMD

## 2022-06-06 ENCOUNTER — Telehealth: Payer: Self-pay

## 2022-06-06 NOTE — Telephone Encounter (Signed)
Left message for pt via interpreter services to see if she could move her appt up on 06/07/22.

## 2022-06-07 ENCOUNTER — Ambulatory Visit (INDEPENDENT_AMBULATORY_CARE_PROVIDER_SITE_OTHER): Payer: Self-pay | Admitting: Obstetrics and Gynecology

## 2022-06-07 ENCOUNTER — Encounter: Payer: Self-pay | Admitting: Obstetrics and Gynecology

## 2022-06-07 VITALS — BP 135/82 | HR 87 | Wt 156.0 lb

## 2022-06-07 DIAGNOSIS — O09812 Supervision of pregnancy resulting from assisted reproductive technology, second trimester: Secondary | ICD-10-CM

## 2022-06-07 DIAGNOSIS — Z3A20 20 weeks gestation of pregnancy: Secondary | ICD-10-CM

## 2022-06-07 DIAGNOSIS — O99210 Obesity complicating pregnancy, unspecified trimester: Secondary | ICD-10-CM

## 2022-06-07 DIAGNOSIS — Z6831 Body mass index (BMI) 31.0-31.9, adult: Secondary | ICD-10-CM

## 2022-06-07 DIAGNOSIS — O0990 Supervision of high risk pregnancy, unspecified, unspecified trimester: Secondary | ICD-10-CM

## 2022-06-07 DIAGNOSIS — O09819 Supervision of pregnancy resulting from assisted reproductive technology, unspecified trimester: Secondary | ICD-10-CM

## 2022-06-07 DIAGNOSIS — O34212 Maternal care for vertical scar from previous cesarean delivery: Secondary | ICD-10-CM

## 2022-06-07 DIAGNOSIS — O09522 Supervision of elderly multigravida, second trimester: Secondary | ICD-10-CM

## 2022-06-07 DIAGNOSIS — O24419 Gestational diabetes mellitus in pregnancy, unspecified control: Secondary | ICD-10-CM

## 2022-06-07 DIAGNOSIS — O0992 Supervision of high risk pregnancy, unspecified, second trimester: Secondary | ICD-10-CM

## 2022-06-07 DIAGNOSIS — O99212 Obesity complicating pregnancy, second trimester: Secondary | ICD-10-CM

## 2022-06-07 DIAGNOSIS — O34219 Maternal care for unspecified type scar from previous cesarean delivery: Secondary | ICD-10-CM

## 2022-06-07 NOTE — Progress Notes (Unsigned)
ROB [redacted]w[redacted]d Pt had anatomy U/S yesterday.  Pt gender reveal today  DO NOT REVEAL GENDER.

## 2022-06-08 ENCOUNTER — Telehealth: Payer: Self-pay

## 2022-06-08 DIAGNOSIS — O99210 Obesity complicating pregnancy, unspecified trimester: Secondary | ICD-10-CM | POA: Insufficient documentation

## 2022-06-08 DIAGNOSIS — Z6831 Body mass index (BMI) 31.0-31.9, adult: Secondary | ICD-10-CM | POA: Insufficient documentation

## 2022-06-08 DIAGNOSIS — O09529 Supervision of elderly multigravida, unspecified trimester: Secondary | ICD-10-CM | POA: Insufficient documentation

## 2022-06-08 MED ORDER — "INSULIN SYRINGE-NEEDLE U-100 30G X 5/16"" 0.3 ML MISC": 1.0000 | Freq: Four times a day (QID) | 1 refills | Status: DC | PRN

## 2022-06-08 MED ORDER — INSULIN NPH (HUMAN) (ISOPHANE) 100 UNIT/ML ~~LOC~~ SUSP: 5.0000 [IU] | Freq: Every day | SUBCUTANEOUS | 0 refills | Status: DC

## 2022-06-08 NOTE — Progress Notes (Signed)
   PRENATAL VISIT NOTE  Subjective:  Jenna Mayo is a 47 y.o. G3P2002 at 11w5dbeing seen today for ongoing prenatal care.  She is currently monitored for the following issues for this high-risk pregnancy and has Supervision of high risk pregnancy, antepartum; Previous cesarean section complicating pregnancy; Pregnancy resulting from in vitro fertilization, antepartum; Prediabetes; Diabetes mellitus during pregnancy in second trimester; AMA (advanced maternal age) multigravida 35+; Obesity in pregnancy; and BMI 31.0-31.9,adult on their problem list.  Patient reports no complaints.  Contractions: Not present. Vag. Bleeding: None.  Movement: Present. Denies leaking of fluid.   The following portions of the patient's history were reviewed and updated as appropriate: allergies, current medications, past family history, past medical history, past social history, past surgical history and problem list.   Objective:   Vitals:   06/07/22 1615  BP: 135/82  Pulse: 87  Weight: 156 lb (70.8 kg)    Fetal Status: Fetal Heart Rate (bpm): 143   Movement: Present     General:  Alert, oriented and cooperative. Patient is in no acute distress.  Skin: Skin is warm and dry. No rash noted.   Cardiovascular: Normal heart rate noted  Respiratory: Normal respiratory effort, no problems with respiration noted  Abdomen: Soft, gravid, appropriate for gestational age.  Pain/Pressure: Absent     Pelvic: Cervical exam deferred        Extremities: Normal range of motion.  Edema: None  Mental Status: Normal mood and affect. Normal behavior. Normal judgment and thought content.   Assessment and Plan:  Pregnancy: G3P2002 at 269w5d. GDM, class A2 AM fastings in the 110s but normal 2h post prandials. She is not waking up and drinking or snacking at night. I told her I recommend starting medications and she is amenable to starting insulin. Will refer to DM education again. I would start at regular 5  units qhs and recommend a 0300 CBG check - Referral to Nutrition and Diabetes Services - Ambulatory referral to Pediatric Cardiology  2. Pregnancy resulting from in vitro fertilization, antepartum She has donor eggs. D/w her need for fetal echo>>referral placed - Ambulatory referral to Pediatric Cardiology  3. [redacted] weeks gestation of pregnancy Continue low dose ASA Needs PP pap ([facility]) - Ambulatory referral to Pediatric Cardiology  4. Gestational diabetes mellitus (GDM) in second trimester, gestational diabetes method of control unspecified  5. Supervision of high risk pregnancy, antepartum  6. Previous cesarean section complicating pregnancy Needs repeat scheduled later in pregnancy  7. Multigravida of advanced maternal age in second trimester Had [place] anatomy u/s yesterday. Follow up results and d/w her need for qmonth growth u/s and start qwk testing at 36wks  8. Obesity in pregnancy  9. BMI 31.0-31.9,adult  Preterm labor symptoms and general obstetric precautions including but not limited to vaginal bleeding, contractions, leaking of fluid and fetal movement were reviewed in detail with the patient. Please refer to After Visit Summary for other counseling recommendations.   Return in about 2 weeks (around 06/21/2022) for in person, md or app, high risk ob.  Future Appointments  Date Time Provider [place]2/21/2024  3:30 PM PrDonnamae JudeMD [facility]  07/05/2022  4:10 PM Anyanwu, UgSallyanne HaversMD [facility]  07/18/2022  4:10 PM Anyanwu, UgSallyanne HaversMD [facility]    ChAletha HalimMD

## 2022-06-08 NOTE — Telephone Encounter (Signed)
Called pt to advise of fetal echo, [place], and DM. She understood appointments.

## 2022-06-09 ENCOUNTER — Encounter: Payer: Self-pay | Admitting: *Deleted

## 2022-06-09 ENCOUNTER — Telehealth: Payer: Self-pay | Admitting: *Deleted

## 2022-06-09 NOTE — Telephone Encounter (Signed)
Called to let her know that we are still waiting on [place] results and once we get them we will call her

## 2022-06-13 ENCOUNTER — Encounter: Payer: Self-pay | Attending: Obstetrics and Gynecology | Admitting: Registered"

## 2022-06-13 ENCOUNTER — Encounter: Payer: Self-pay | Admitting: Registered"

## 2022-06-13 DIAGNOSIS — O24419 Gestational diabetes mellitus in pregnancy, unspecified control: Secondary | ICD-10-CM | POA: Insufficient documentation

## 2022-06-13 NOTE — Progress Notes (Signed)
Insulin Instruction  Patient was seen on 06/13/2022 for insulin instruction.   Start: 1218     End: 1250   MD orders are:   NPH 5 units qhs  The following learning objectives were met by the patient during this visit:   Insulin Action of NPH and R insulins  Reviewed syringe & vial including # units per syringe and vial  Hygiene and storage  Drawing up single dose using vials  Rotation of Sites  Hypoglycemia- symptoms, causes, treatment choices  Record keeping and MD follow up  Insulin needs change in 3rd trimester  Patient demonstrated understanding of insulin administration by return demonstration.  Patient received the following handouts: Insulin Instruction Handout Low Blood Sugar                                        Patient to start on insulin as Rx'd by MD

## 2022-06-20 ENCOUNTER — Other Ambulatory Visit: Payer: Self-pay | Admitting: *Deleted

## 2022-06-20 NOTE — Progress Notes (Signed)
erroneous

## 2022-06-21 ENCOUNTER — Telehealth: Payer: Self-pay | Admitting: *Deleted

## 2022-06-21 NOTE — Telephone Encounter (Signed)
Called to inform her of [place] , pt already has follow up US scheduled and will make sure Fetal Echo is scheduled as well.

## 2022-06-22 ENCOUNTER — Ambulatory Visit (INDEPENDENT_AMBULATORY_CARE_PROVIDER_SITE_OTHER): Payer: Self-pay | Admitting: Family Medicine

## 2022-06-22 VITALS — BP 112/75 | HR 92 | Wt 156.0 lb

## 2022-06-22 DIAGNOSIS — O24419 Gestational diabetes mellitus in pregnancy, unspecified control: Secondary | ICD-10-CM

## 2022-06-22 DIAGNOSIS — O0992 Supervision of high risk pregnancy, unspecified, second trimester: Secondary | ICD-10-CM

## 2022-06-22 DIAGNOSIS — Z3A22 22 weeks gestation of pregnancy: Secondary | ICD-10-CM

## 2022-06-22 DIAGNOSIS — O34219 Maternal care for unspecified type scar from previous cesarean delivery: Secondary | ICD-10-CM

## 2022-06-22 DIAGNOSIS — O0990 Supervision of high risk pregnancy, unspecified, unspecified trimester: Secondary | ICD-10-CM

## 2022-06-22 DIAGNOSIS — O09522 Supervision of elderly multigravida, second trimester: Secondary | ICD-10-CM

## 2022-06-22 MED ORDER — INSULIN NPH (HUMAN) (ISOPHANE) 100 UNIT/ML ~~LOC~~ SUSP: 10.0000 [IU] | Freq: Every day | SUBCUTANEOUS | 0 refills | Status: DC

## 2022-06-22 NOTE — Progress Notes (Signed)
CC: fasting glucose still around 102 or so

## 2022-06-22 NOTE — Progress Notes (Signed)
   PRENATAL VISIT NOTE  Subjective:  Jenna Mayo is a 47 y.o. G3P2002 at 32w6dbeing seen today for ongoing prenatal care.  She is currently monitored for the following issues for this low-risk pregnancy and has Supervision of high risk pregnancy, antepartum; Previous cesarean section complicating pregnancy; Pregnancy resulting from in vitro fertilization, antepartum; Prediabetes; Diabetes mellitus during pregnancy in second trimester; AMA (advanced maternal age) multigravida 35+; Obesity in pregnancy; and BMI 31.0-31.9,adult on their problem list.  Patient reports no complaints.  Contractions: Not present. Vag. Bleeding: None.  Movement: Present. Denies leaking of fluid.   The following portions of the patient's history were reviewed and updated as appropriate: allergies, current medications, past family history, past medical history, past social history, past surgical history and problem list.   Objective:   Vitals:   06/22/22 1559  BP: 112/75  Pulse: 92  Weight: 156 lb (70.8 kg)    Fetal Status: Fetal Heart Rate (bpm): 146 Fundal Height: 22 cm Movement: Present     General:  Alert, oriented and cooperative. Patient is in no acute distress.  Skin: Skin is warm and dry. No rash noted.   Cardiovascular: Normal heart rate noted  Respiratory: Normal respiratory effort, no problems with respiration noted  Abdomen: Soft, gravid, appropriate for gestational age.  Pain/Pressure: Absent     Pelvic: Cervical exam deferred        Extremities: Normal range of motion.  Edema: None  Mental Status: Normal mood and affect. Normal behavior. Normal judgment and thought content.   Assessment and Plan:  Pregnancy: G3P2002 at 268w6d. Gestational diabetes mellitus (GDM) in second trimester, gestational diabetes method of control unspecified Reports fastings are still too high--increase to 10 u NPH q hs  - insulin NPH Human (NOVOLIN N) 100 UNIT/ML injection; Inject 0.1 mLs (10  Units total) into the skin at bedtime.  Dispense: 10 mL; Refill: 0  2. Supervision of high risk pregnancy, antepartum Normal anatomy u/s  3. Previous cesarean section complicating pregnancy For RCS  4.22Multigravida of advanced maternal age in second trimester On ASA Testing @ 36 weeks  Preterm labor symptoms and general obstetric precautions including but not limited to vaginal bleeding, contractions, leaking of fluid and fetal movement were reviewed in detail with the patient. Please refer to After Visit Summary for other counseling recommendations.   Return in 2 weeks (on 07/06/2022).  Future Appointments  Date Time Provider [place]3/08/2022  4:10 PM Anyanwu, UgSallyanne HaversMD [facility]  07/18/2022  4:10 PM Anyanwu, UgSallyanne HaversMD [facility]    TaDonnamae JudeMD

## 2022-06-22 NOTE — Patient Instructions (Signed)
For cold and allergy symptoms  You may take Mucinex, (Allegra, Claritin, Zyrtec, Xyzal, Clarinex-any one of these--they are the same class--same as Benadryl), Sudafed (must be behind the counter, should not be phenylephrine), saline or steroid (Nasacort, Nasonex and Flonase) nasal sprays.

## 2022-07-05 ENCOUNTER — Encounter: Payer: Self-pay | Admitting: Obstetrics & Gynecology

## 2022-07-05 ENCOUNTER — Encounter: Payer: Self-pay | Admitting: *Deleted

## 2022-07-05 ENCOUNTER — Ambulatory Visit (INDEPENDENT_AMBULATORY_CARE_PROVIDER_SITE_OTHER): Payer: Self-pay | Admitting: Obstetrics & Gynecology

## 2022-07-05 VITALS — BP 118/76 | HR 90 | Wt 157.0 lb

## 2022-07-05 DIAGNOSIS — O0990 Supervision of high risk pregnancy, unspecified, unspecified trimester: Secondary | ICD-10-CM

## 2022-07-05 DIAGNOSIS — O09522 Supervision of elderly multigravida, second trimester: Secondary | ICD-10-CM

## 2022-07-05 DIAGNOSIS — O24414 Gestational diabetes mellitus in pregnancy, insulin controlled: Secondary | ICD-10-CM

## 2022-07-05 DIAGNOSIS — Z3A24 24 weeks gestation of pregnancy: Secondary | ICD-10-CM

## 2022-07-05 NOTE — Patient Instructions (Addendum)
Return to office for any scheduled appointments. Call the office or go to the MAU at [place] [facility] if: You begin to have strong, frequent contractions Your water breaks.  Sometimes it is a big gush of fluid, sometimes it is just a trickle that keeps getting your underwear wet or running down your legs You have vaginal bleeding.  It is normal to have a small amount of spotting if your cervix was checked.  You do not feel your baby moving like normal.  If you do not, get something to eat and drink and lay down and focus on feeling your baby move.   If your baby is still not moving like normal, you should call the office or go to MAU. Any other obstetric concerns.   TDaP Vaccine Pregnancy Get the Whooping Cough Vaccine While You Are Pregnant (CDC)  It is important for women to get the whooping cough vaccine in the third trimester of each pregnancy. Vaccines are the best way to prevent this disease. There are 2 different whooping cough vaccines. Both vaccines combine protection against whooping cough, tetanus and diphtheria, but they are for different age groups: Tdap: for everyone 11 years or older, including pregnant women  DTaP: for children 2 months through 83 years of age  You need the whooping cough vaccine during each of your pregnancies The recommended time to get the shot is during your 27th through 36th week of pregnancy, preferably during the earlier part of this time period. The Centers for Disease Control and Prevention (CDC) recommends that pregnant women receive the whooping cough vaccine for adolescents and adults (called Tdap vaccine) during the third trimester of each pregnancy. The recommended time to get the shot is during your 27th through 36th week of pregnancy, preferably during the earlier part of this time period. This replaces the original recommendation that pregnant women get the vaccine only if they had not previously received it. The [employer] of Obstetricians and Gynecologists and the [employer] support this recommendation.  You should get the whooping cough vaccine while pregnant to pass protection to your baby frame support disabled and/or not supported in this browser  Learn why Mickel Baas decided to get the whooping cough vaccine in her 3rd trimester of pregnancy and how her baby girl was born with some protection against the disease. Also available on YouTube. After receiving the whooping cough vaccine, your body will create protective antibodies (proteins produced by the body to fight off diseases) and pass some of them to your baby before birth. These antibodies provide your baby some short-term protection against whooping cough in early life. These antibodies can also protect your baby from some of the more serious complications that come along with whooping cough. Your protective antibodies are at their highest about 2 weeks after getting the vaccine, but it takes time to pass them to your baby. So the preferred time to get the whooping cough vaccine is early in your third trimester. The amount of whooping cough antibodies in your body decreases over time. That is why CDC recommends you get a whooping cough vaccine during each pregnancy. Doing so allows each of your babies to get the greatest number of protective antibodies from you. This means each of your babies will get the best protection possible against this disease.  Getting the whooping cough vaccine while pregnant is better than getting the vaccine after you give birth Whooping cough vaccination during pregnancy is ideal so  your baby will have short-term protection as soon as he is born. This early protection is important because your baby will not start getting his whooping cough vaccines until he is 2 months old. These first few months of life are when your baby is at greatest risk for catching whooping cough. This is also when he's at greatest  risk for having severe, potentially life-threating complications from the infection. To avoid that gap in protection, it is best to get a whooping cough vaccine during pregnancy. You will then pass protection to your baby before he is born. To continue protecting your baby, he should get whooping cough vaccines starting at 2 months old. You may never have gotten the Tdap vaccine before and did not get it during this pregnancy. If so, you should make sure to get the vaccine immediately after you give birth, before leaving the hospital or birthing center. It will take about 2 weeks before your body develops protection (antibodies) in response to the vaccine. Once you have protection from the vaccine, you are less likely to give whooping cough to your newborn while caring for him. But remember, your baby will still be at risk for catching whooping cough from others. A recent study looked to see how effective Tdap was at preventing whooping cough in babies whose mothers got the vaccine while pregnant or in the hospital after giving birth. The study found that getting Tdap between 27 through 36 weeks of pregnancy is 85% more effective at preventing whooping cough in babies younger than 2 months old. Blood tests cannot tell if you need a whooping cough vaccine There are no blood tests that can tell you if you have enough antibodies in your body to protect yourself or your baby against whooping cough. Even if you have been sick with whooping cough in the past or previously received the vaccine, you still should get the vaccine during each pregnancy. Breastfeeding may pass some protective antibodies onto your baby By breastfeeding, you may pass some antibodies you have made in response to the vaccine to your baby. When you get a whooping cough vaccine during your pregnancy, you will have antibodies in your breast milk that you can share with your baby as soon as your milk comes in. However, your baby will not get  protective antibodies immediately if you wait to get the whooping cough vaccine until after delivering your baby. This is because it takes about 2 weeks for your body to create antibodies. Learn more about the health benefits of breastfeeding.  [place] [phone]) [facility] Owens Shark, MD; Erin Hearing, MD; Gwendlyn Deutscher, MD; Andria Frames, MD; McDiarmid, MD; Dutch Quint, [address] [place], [place] [address] [phone] Mon-Fri 8:30-12:30, 1:30-5:00  Providers come to see babies during newborn hospitalization Only accepting infants of Mother's who are seen at [facility] or have siblings seen at   [place] Medicaid - Yes; [place], MD [address], [place], [place] [address] [phone] Mon, Tue, Thur, Fri 8:30-5:00, Wed 10:00-7:00 (closed 1-2pm daily for lunch) [facility] residents with no insurance.  [place] only with Medicaid/insurance; Tricare - no  [facility]) - Tim and Sanford [place], MD; Owens Shark, MD; Tamera Punt, MD; Doneen Poisson, MD; Fatima Sanger, MD; Lindwood Qua, MD; Thornell Sartorius, MD; Ronnald Ramp,  MD; Wynetta Emery, MD; Jess Barters, MD; Tami Ribas, MD; Derrell Lolling, MD; Dorothyann Peng, MD; Lucious Groves, NP [address], [place], [place] [address] [phone] Mon, Tue, Thur, Fri 8:30-5:30, Wed 9:30-5:30, Sat 8:30-12:30 Only accepting infants of first-time parents or siblings of current patients Hospital discharge coordinator will make follow-up appointment Medicaid - yes; Tricare - yes  [place] [phone]) [facility] Garnette Czech, MD; Rosana Hoes, MD; Servando Salina, MD; Rose Fillers, MD; Corinna Capra, MD; [facility], MD; Javier Glazier, MD; Janann Colonel, MD; Jimmye Norman, [place], [place], [place] [address] [phone] Mon-Fri 8:30-5:00, closed for lunch 12:30-1:30; Sat-Sun 10:00-1:00 Accepting Newborns with commercial  insurance only, must call prior to delivery to be accepted into  practice.  Medicaid - no, Tricare - yes   [place], [place] [address] [phone] or [phone] Mon to Fri 8am to 10pm, Sat 8am to 1pm (virtual only on weekends) Only accepts Medicaid Healthy Blue pts  [facility] ([facility]) - Pediatrics at Rogelia Boga, MD; Vilma Prader, MD; Vanita Panda, MD; Roxanne Mins, NP; Nestor Lewandowsky, MD; Ronney Lion, MD [address] [place], [place] [address] [phone] Mon-Fri 8:30-5:30 Medicaid - yes, Tricare - yes  [place] [phone]) [facility] Burnadette Pop, MD [address], [place], [place] [address] [phone] Sammuel Cooper, Wed Fri 8:30-5:00, Sat 8:30-12:00, Closed Thursdays Accepting siblings of established patients and first time mom's if you call prenatally Medicaid- yes; Tricare - yes  [place] at Arlina Robes, [place]; Mannie Stabile, MD; Jerald Kief; [place]Rober[place], [place]; Nancy Fetter, MD; Moreen Fowler, MD;  [address] [place], [place] [address] [phone] Mon-Fri 8:30-5:00, closed for lunch 1-2 Only accepting newborns of established patients Medicaid- no; Tricare - yes  [facility] [phone]) [place] at Leane Para, MD; [place], [place], [place] [address] [phone] Mon-Fri 8:00-5:00 Medicaid - No; Tricare - Yes  [place] at [facility], [place]; [place], [place], [place], [place] [address] [phone] Mon-Fri 8:00-5:00 Medicaid - No, Tricare - Yes  [facility] Abner Greenspan, MD; Sheran Lawless, MD; [place], [place] [address], [address] [place], [place] [address] [phone]  Mon-Fri 8:00-5:00 Medicaid - No; Tricare - Yes  [place] [place], [place] [address] [phone] Mon-Fri 8:30-5:00 (lunch 12:00-1:00) Medicaid -Yes; [place] at  [place], MD [place], [place], [place] [address] [phone] Mon-Fri 8:00-5:00 Seeing newborns of current patients only. No new patients Medicaid - No, Tricare - yes  Therapist, music at [place], [address] [place], [place] [address] [phone] Mon-Fri 8:00-5:00 Medicaid -yes as secondary coverage only; Tricare - yes  [place], [place]; [place], [place]; Albertina Parr, MD; Frederic Jer[place], MD; Ronney Lion, MD; [place], [place]; Sm[place], NP; Corinna Lines, MD; [place], MD [address] [place], [place] [address] [phone] Mon-Fri 8:30-5:00, Sat 9:00-11:00 Accepts commercial insurance ONLY. Offers free prenatal information sessions for families. Medicaid - No, Tricare - Call first  [place], MD; [place], [place]; [place], [place]; [place], [address]., [place] [address] [phone] Mon-Fri 7:30-5:30 Medicaid - Yes; Marchelle Gearing yes  [place] [phone] & [phone])  [facility], [address] [place], [place] [address] [phone] Mon-Thur 8:00-6:00, closed for lunch 12-2, closed Fridays Medicaid - yes; Tricare - no  [place], NP; Melford Aase, MD; [place], [place]; [place], [place] [address] [place], [place], [place] [address] [phone] Mon-Fri 7:30-4:30 Medicaid - yes, Tricare - yes  [facility]  Carolynn Sayers, MD; Cristino Martes, NP; Gertie Baron, MD; Debara Pickett, [place]  [address], [place], [place] [address] [phone] Mon-Fri 8:30-5:00, closed for lunch 1-2, Sat 8:30-12:00 - sick visits only Providers come to see babies at [facility] Only accepting newborns of siblings and first time parents ONLY if who have met with office prior to delivery Medicaid -Yes; Tricare - yes  [place], [place]; Fredderick Severance, NP; Juleen China, MD; Clydene Laming, MD:  [place] [address], [place], [place]  [address] [phone] Mon- Fri 8:00-5:00, Sat 9:00-12:00 - sick visits only Accepting siblings of established patients and first time mom/baby Medicaid - Yes; Tricare - yes Patients must have vaccinations (baby vaccines)  [place]west [place] [phone] & [phone])  [place] at [address] [place], [place] [address] [phone] Mon-Fri 8:00-5:00 Medicaid - no; Tricare - yes  [place], MD; [place], [place]; [place], Chit[address], [place], [place] [address] [phone] Mon-Fri 8:00-5:00 Medicaid- yes; Tricare - yes  [place], MD; Ronnald Ramp, NP; [place], [place] [address] [place], [place], [place] [address] [phone] Mon-Fri 8:00-5:00 Medicaid - Yes; Tricare - yes  [address] [place] [phone])  [place], [place]; [place], [place]; Maisie Fus, MD; Charlesetta Garibaldi, MD; [place], NP; Isenhour, DO; [place], [place]; Jeannine Kitten, MD; Sheila Oats, MD; Hardin Negus, MD; [place], [place]; [place], [place]; [place], [place] [address] [place], [place], [place] [address] [phone] Mon-Fri 8:30-5:00, Sat 9:00-12:00 - sick only Please register online [email or web address] then schedule online or call office Medicaid-Yes; Tricare -yes  [facility] - Premier  Dab[place]co, MD; Delora Fuel, MD; [place], MD; [place], NP; [place], [place]; Everette Rank, MD; Radford Pax, NP; Melina Modena, MD [address] [place], [place], [place] [address] [phone] Mon-Fri 8:00-5:30, Sat&Sun by appointment (phones open at 8:30) Medicaid - Yes; Tricare - yes  [place] [phone] & [phone]) [place], CPNP; [place], MD; Araceli Bouche, MD; Jerelene Redden, NP; [place], DO [address], [place], [place] [address] [phone] M-F 8:00 - 5:15, Sat/Sun 9-12 sick visits only Medicaid - No; Tricare - yes  [place],  PA-C; Scho[place], PA-C; [place], DO; [place], PA-C; [place], PA-C; Vassie Moselle, [address] [place], [place] [address] [phone] Mon-Thur 8:00-7:00, Fri 8:00-5:00 Accepting Medicaid for 13 and under only   [facility] at [facility]) [place], [place]; List, FNP; Selinda Eon, MD; Christie Nottingham, PA-C; Hubbard Hartshorn, MD; Modena Nunnery, FNP; Everlean Patterson, FNP; Tempie Donning, MD; Selinda Eon, [place] [address] [place], [place] [address] [phone] Mon-Fri 8:30-5:30 Medicaid - Yes; Tricare - yes  [place], [place]; Rolla Plate, MD; Carola Rhine, MD; Tyron Russell, MD; [place], NP [address], [address], [place], [place] [address] [phone] Mon-Thur 8:00-5:30, Fri 8:00-5:00, Sat 9:00-12:00 Medicaid - yes, Tricare - yes  [place] [phone])  [place] at [facility] [place], [place]; Olen Pel, MD; [place], [place], [place], [place] [address] [phone] Mon-Fri 8:00-5:00, closed for lunch 12-1 Medicaid - No; [place] - yes  Therapist, music at [facility], [place], [place], [place] [address] [phone] Mon-Fri 8:00-5:00 Medicaid - No; Tricare - yes  [facility], MD; Joelene Millin, MD; Teryl Lucy, MD; Ronnald Ramp, MD [address], [place], [place] [address] [phone] Mon-Fri 8:00-5:00 Medicaid- Yes;  Tricare - yes  [place] [phone])  [place] at [facility], [place]; [place], MD [address] [place] [address] [place], [place], [place] [address] [phone] Mon-Fri 8:00-5:00 Medicaid - No; [place] - yes  [place] [address] [place], [place], [place] [address] [phone] Mon-Weds 8:00-6:00, Thurs-Fri 8:00-5:00, Sat 9:00-12:00 Medicaid - yes; Tricare - yes   [place], MD; [place], [place], [place] [address] [phone] Mon-Fri  8:00-5:00 Medicaid - yes; [place] - yes  [facility] Pediatric Providers  [facility] [address] [place], [place] [address] [phone] Gentry Roch: 8am -8pm, Tues, Weds: 8am - 5pm; Fri: 8-1 Medicaid - Yes; Tricare - yes  [facility] Ilda Mori, MD; Wynetta Emery, MD; Rock Nephew, MD; [place], [place]; [place], [place] [address], [place], [place] [address] [phone] M-F 8:30 - 5:00 Medicaid - Call office; Tricare -yes  [facility] Fredderick Erb, MD; Wynelle Cleveland, MD, Cherylann Banas, MD; Koren Bound, PNP; Terrial Rhodes, [address] [place], [place] [address] [phone] M-F 8:30 - 5:00, Sat/Sun 8:30 - 12:30 (sick visits) Medicaid - Call office; Tricare -yes  [facility] Bobby Rumpf, MD; Wynetta Emery, PNP; Jaynie Crumble, MD; [place], [place]; Rembert, NP; Orson Aloe [address] [place], [place], [place] [address] [phone] M-F 8:30 - 5:00 Medicaid - Call office; Tricare - yes  [facility] Collene Leyden, MD; Marney Doctor, MD; Melburn Hake, MD; Franki Cabot, MD; Nogo, MD [phone] [address] [place], [place] [address] [phone] M-Thur: 8:00 - 5:00; Fri: 8:00 - 4:00 Medicaid - yes; Tricare - yes  [facility] [address] [place], [place] [address] [phone] M-F: 8:30- 5:00, closed for lunch 12:30 - 1:00 Medicaid - yes; Tricare -yes  [place] [address] [place], [place] [id number] [phone] M-F 8:00 - 5:00 Medicaid - yes; Tricare - yes  [place], DO; [place], DO; [place], [place] [address] [place], [place] [address] [phone] M-F 8:00 - 5:00, Closed 12-1 for lunch Medicaid - Call; [place] [facility] - yes  [place], MD [address], [place], [place] [address] [phone] M-F: 8:00-5:00, Sat: 8:00 - noon Medicaid - call; [place] -yes  [facility] [place], [place] [address] [place],  [place], [place] [address] [phone] M-W: 8:00-5:00, Thur: 8:00 - 7:00, Fri: 8:00 - noon Medicaid - yes; Tricare - yes  [place], [place], [place], [place] [address] [phone] M-F 8:00 - 5:00, Closed for lunch 12-1 Medicaid - yes; [place] - yes  [facility] Pediatric Providers  [facility] Primary Care at [place], [place], Trilby Drummer, MD, [place], [place], [facility], Carm[place], [place], [place] [address] [phone] M-T 8:00-5:00, Wed-Fri 7:00-6:00 Medicaid - Yes; Tricare -yes  [place] at [facility], DO; [address] [place], [place], [place] [address] [phone] M-F 8:00 - 5:00, closed for lunch 12-1 Medicaid - Yes; Tricare - yes  [facility]  Drema Dallas, MD; Marilynn Rail, MD; Kerby Less, MD; Margaretmary Eddy, MD; Damita Dunnings, MD; Deidre Ala, MD; Arrie Eastern, MD, Leward Quan, MD; Sherren Mocha, MD; Emmit Pomfret, MD; Morton Stall, MD; Clydene Laming, MD [address] [place], [place] [address] [phone] M-F 8:00-5:00 Medicaid - yes; Tricare - yes  [facility]  [facility], MD (speaks [place] and Hindi) [address] [place], [place] [address] [phone] M-F: 8:30 - 5:00, closed 12:30 -  1 for lunch Medicaid - Yes; Tricare -yes  [facility] Pediatric Providers  [facility] Delice Lesch, MD; Lindell Noe, MD; Lavone Neri, [place], [place], [place] [address] [phone] M-Th: 8:00 - 5:30, Fri: 8:00 - 12:00 Medicaid - yes; Tricare - yes  [facility], NP; Verlee Monte, MD; Nadeen Landau, MD [place] [address], [place], [place] [address] [phone] M-F: 8:00 - 5:00 Medicaid - yes; Tricare - yes  [place], NP; Deon Pilling, NP; Angelica Pou, NP; Manya Silvas, MD; Jimmye Norman, MD, [place], NP, Glo Herring, MD; Cathleen Fears [address] [place], [place] [address] [phone] M-F: 8:30 - 5:30p Medicaid - yes;  Tricare - yes Other locations available as well  [facility]), MD; Redmond Pulling, MD; Marcine Matar, [place], [place], [place] [address] [phone] M-W: 8:00am - 7:00pm, Thurs: 8:00am - 8:00pm; Fri: 8:00am - 5:00pm, closed daily from 12-1 for lunch Medicaid - yes; [place] - yes  [facility] Pediatric Providers  [facility] at Robb Matar, MD; Donella Stade, [place]; Carrolyn Meiers, MD; Johnnette Litter, MD; [place], [place]; Leonie Man; [place], [place]; Alcario Drought, MD;  [address], [place], [place] [address] [phone] Jerilynn Mages - Ludwig Clarks: [place], Sat 9-noon Medicaid - Yes; Tricare -yes  [facility] at Smitty Knudsen, MD; Ronnald Ramp, FNP; Sherryle Lis, MD; Teryl Lucy, [place]. Sharyl Nimrod, [phone] M-F 8:00 - 5:00 Medicaid - call; Tricare - yes  [facility]- Lollie Sails, MD; [place], [place]; Lawana Chambers, MD; Ouida Sills, MD; Red Christians, MD; Marzetta Board, MD; Emiliano Dyer; Randolm Idol, MD; Derald Macleod, MD; [place], [place], [place], [place] [address] [phone] M-F 8:00am - 5:00pm; Sat. 9:00 - 11:00 Medicaid - yes; Tricare - yes  [facility] at [facility], MD [address] [place], [place] [address] [phone] M-F 8:00 - 5:00 Medicaid - [place] Medicaid only; Tricare - yes  [facility] - Signa Kell, MD; Rosana Hoes, [place]; Gordan Payment, [place], [place], [place] [address] [phone] M-F 8:00 - 5:00 Medicaid - yes; Tricare - yes  [facility] [address] Pediatrics - Francine Graven, MD; Owens Shark, MD, [facility], MD, [place], MD; Arabi, MD; Tamala Julian, [place]; [address] Nyoka Lint [place], [place] [address] [phone] M-F: 8-5 Medicaid - yes; [place] - yes  [place], [place]; [place], MD; [address], [place], [place] [address] [phone] M-F 8-5 Medicaid - yes; Tricare - yes  [address] [place] Joycelyn Rua, MD; Joelene Millin, MD; [place], MD; [place], DNP; [place], [place] [address], [place], [place] [address] [phone] M-F 8-5 Medicaid - yes; Tricare - yes  [facility] Danelle Earthly, MD; Vinson Moselle; Lucie Leather, MD [address], [place], [place] [phone] [phone] M-F 7am - 5 pm Medicaid - call; Tricare - yes  [place], [place]; Louanne Skye, MD; Quentin Cornwall, [place], [place], [place] [address] [phone] M-F 8-5 Medicaid - yes; Tricare - yes  [facility] Pascal Lux, MD; Suzan Slick, MD; Jimmye Norman, [place]; Rolena Infante, [place]; Thornton Papas, [place]; Robinette Haines; [facility] - FNP [address], [place],  [address] [phone] M-F 8-5 Medicaid- yes; Tricare - yes  [facility] - [place] Sellers, Lively and Willa Frater, MD; Dianna Rossetti, MD; Frances [place], MD; Nicole Kindred, MD; [place], [place]; Marijean Bravo, MD; Louanne Skye, MD; Benjamine Mola, MD [address], [place],  [address] [phone] M-F: 8-5, Sat: 9-4, Sun  9-12 Medicaid - yes; Tricare - yes  [place], PNP; Rosana Hoes, [place] [address] Nyoka Lint [place], [place] [address] [phone] M-F 8 - 5, closed 12-1 for lunch Medicaid - yes; Tricare - yes  [place], MD; Enid Derry, MD; Benjamine Mola, MD; [place], [place], [place], [place] [address] [phone] M-F 8- 5:30 Medicaid - yes; Tricare - yes  Rafael Hernandez, MD; Jeanell Sparrow, MD; Bartholome Bill, [place], [place], [place] [address] [phone] Jerilynn Mages: Sharyne Rich[place]; Tues-Fri: 8-5; Sat: 9-12 Medicaid - yes; Tricare - yes  Signa [facility] - Myrene Buddy, MD; Bjorn Loser, MD; Carlis Abbott, MD; Claudean Kinds, MD; Erik Obey, MD [address] [place], [place]  [address] [phone] Jerilynn MagesMarland Kitchen Sharyne RichtersDarrin Luis: 8-5; Sat: 8:30-12:30 Medicaid - yes; Tricare - yes  Dareen Piano [place], MD; [place], [place], [place], [place] [address] [phone] Mon-Fri: 8-5 Medicaid - yes; Tricare - yes  [facility] [place] Beasley, CPNP; [place], [place]; Benjamine Mola, MD; Donivan Scull, MD; Noel Journey, MD; [address] [place], [place] [address] [phone] M-F: 8-5, closed 1-2 for lunch Medicaid - yes; Tricare - yes  [place], [place]; [place], [place]; Tamala Julian, MD; [place], CPNP; [place], [place]; [place], MD; Juleen China, MD [address] [place], [place], [place] [address] [phone] M-Thurs: Sharyne Richters; Fri: 8-6; Sat: 9-12; Sun 2-4 Medicaid - yes; Tricare - yes  Signa Kell [facility] [place], MD; Vernard Gambles, MD; Carol Ada, FNP; Marjorie Smolder, DO; [address], [place], [place] [address] [phone] M-F: 8-5 Medicaid - yes; [place] - yes  [facility] Pediatric Providers  [place], MD; [place], [place], [place], [place] [address] [phone] M - Fri: 8am - 5pm, closed for lunch 12-1 Medicaid - Yes; Tricare - yes  [facility] and [place], MD; Ovid Curd, MD; [place], DO; Vin[place], MD;Hall, PA; Volanda Napoleon, [place]; Megan Salon, NP [phone] [address], [place], [place] [address] [phone] M-F 8:00 - 5:00, Sat 8:00 - 11:30 Medicaid - yes; Tricare - yes  [facility] Humphrey Rolls, MD; [place], MD, [address] MD, [place], MD, [place], MD; [place], NP; [place], [place];  [address], [place], [place] [address] [phone] M-F 8:10am - 5:00pm Medicaid - yes; [place] - yes  [place], MD; [place], NP [address], [place], [place]  [address] [phone] M-F 8:00 - 5:00 Medicaid - [place] Medicaid only; Tricare - yes  [place], MD; [place], NP [place], [place], [place] [address] [phone] M-F 8:00 - 5:00; Closed for lunch 12 - 1:00 Medicaid - yes; Tricare - yes  [place], MD; Darius Bump, [place], [place], [place] [address] [phone] Mon 9-5; Tues/Wed 10-5; Thurs 8:30-5; Fri: 8-12:30 Medicaid - yes; Tricare - yes  [facility] Pediatric Providers  [facility]  [place], MD; [place], [place] [address], [place], [place] [address] [phone] phone [phone] fax M-F 7:15 - 4:30 Medicaid - yes; Tricare - yes  [facility] Anastasio Champion, MD; [place], DO [address] [place],  [address] [phone] M-Fri: 8:30 - 5:00, closed for lunch everyday noon - 1pm Medicaid - Yes;  Tricare - yes  [facility] [place], MD; Quillian Quince, MD; Nadara Mustard, MD; Quintin Alto, MD; [place], [place]; Georgianne Fick; [place], [place]; [place], [place]; [place], [place]; [place], [place] [address] [place], [place] [address] [phone] M-Thurs: 7:30am - 7:00pm; Friday 7:30am - 4pm; Sat: 8:00 - 1:00 Medicaid - Yes; Tricare - yes  [facility] Busman, MD; Lanny [place], MD; Janit Bern, MD; [place], [place]. [address] [place], [place], [place] [address] [phone] M-Thur: 8:00 - 5:00, Fri: 8:00 - Noon Medicaid - yes; Tricare - yes No [place], MD; Lajuana Ripple, DO; [place], NP; Hassell Done, NP; Lilia Pro, NP; Jeneen Montgomery, NP; Thayer Ohm, NP; Livia Snellen, MD; [place], [place] [address] [place] [address], [place], [place] [address] [phone] M-F 8:00 - 5:00 Medicaid - yes; Tricare - yes  Zillah Collins, FNP-C; [place], FNP-C [address] [place], [place] [address] [phone] M, W, R 8:00-5:00, Tues: 8:00am - 7:00pm;  Fri 8:00 - noon Medicaid - Yes; Tricare - yes  [facility], MD [place] [place], [place] [address] [phone]  M-Thurs 8:30-5:30, Fri: 8:30-12:30pm Medicaid - Yes; Tricare - N

## 2022-07-05 NOTE — Progress Notes (Signed)
   PRENATAL VISIT NOTE  Subjective:  Jenna Mayo is a 47 y.o. G3P2002 at 61w5dbeing seen today for ongoing prenatal care.  Accompanied by FOB. She is currently monitored for the following issues for this high-risk pregnancy and has Supervision of high risk pregnancy, antepartum; Previous cesarean section x 2 complicating pregnancy; Pregnancy resulting from in vitro fertilization, antepartum; Prediabetes; Diabetes mellitus during pregnancy in second trimester; AMA (advanced maternal age) multigravida 452+ Obesity in pregnancy; and BMI 31.0-31.9,adult on their problem list.  Patient reports no complaints.  Contractions: Not present. Vag. Bleeding: None.  Movement: Present. Denies leaking of fluid.   The following portions of the patient's history were reviewed and updated as appropriate: allergies, current medications, past family history, past medical history, past social history, past surgical history and problem list.   Objective:   Vitals:   07/05/22 1629  BP: 118/76  Pulse: 90  Weight: 157 lb (71.2 kg)    Fetal Status: Fetal Heart Rate (bpm): 158   Movement: Present     General:  Alert, oriented and cooperative. Patient is in no acute distress.  Skin: Skin is warm and dry. No rash noted.   Cardiovascular: Normal heart rate noted  Respiratory: Normal respiratory effort, no problems with respiration noted  Abdomen: Soft, gravid, appropriate for gestational age.  Pain/Pressure: Absent     Pelvic: Cervical exam deferred        Extremities: Normal range of motion.     Mental Status: Normal mood and affect. Normal behavior. Normal judgment and thought content.   Assessment and Plan:  Pregnancy: G3P2002 at 257w5d. Insulin controlled gestational diabetes mellitus (GDM) in second trimester 06/27/22 ultrasound at [facility] showed EFW 627g /51%, normal AFV, fundal placenta. Patient did not bring log but reports improvement in her fasting BS to 80s-90s on NPH 10 qhs.   Emphasized importance of bringing log to all visits.  Discussed that she does have a high risk pregnancy and it is preferred that her scans are done by MFM, she will consider this an apply for financial assistance to help with costs. In the meantime[facility] 2. Multigravida of advanced maternal age in second trimester On ASA.  3. [redacted] weeks gestation of pregnancy 4. Supervision of high risk pregnancy, antepartum No other concerns. Will do third trimester labs next visit. Preterm labor symptoms and general obstetric precautions including but not limited to vaginal bleeding, contractions, leaking of fluid and fetal movement were reviewed in detail with the patient. Please refer to After Visit Summary for other counseling recommendations.   Return in about 3 weeks (around 07/26/2022) for OFFICE OB VISIT (MD only).  Future Appointments  Date Time Provider [place]3/26/2024  4:10 PM Jocabed Cheese, UgSallyanne HaversMD [facility]    UgVerita SchneidersMD

## 2022-07-13 ENCOUNTER — Telehealth: Payer: Self-pay

## 2022-07-13 NOTE — Telephone Encounter (Signed)
Pt called in to ask if covid vaccine was safe during pregnancy. Verified with Altha Harm, RN that it was okay. Pt voiced understanding

## 2022-07-14 ENCOUNTER — Ambulatory Visit (INDEPENDENT_AMBULATORY_CARE_PROVIDER_SITE_OTHER): Payer: Self-pay

## 2022-07-14 VITALS — BP 110/73 | HR 93

## 2022-07-14 DIAGNOSIS — Z23 Encounter for immunization: Secondary | ICD-10-CM

## 2022-07-14 NOTE — Progress Notes (Signed)
Pt here for flu vaccine, Pt tolerated well

## 2022-07-18 ENCOUNTER — Encounter: Payer: Self-pay | Admitting: Obstetrics & Gynecology

## 2022-07-26 ENCOUNTER — Encounter: Payer: Self-pay | Admitting: Obstetrics & Gynecology

## 2022-07-26 ENCOUNTER — Ambulatory Visit (INDEPENDENT_AMBULATORY_CARE_PROVIDER_SITE_OTHER): Payer: Self-pay | Admitting: Obstetrics & Gynecology

## 2022-07-26 VITALS — BP 129/77 | HR 98 | Wt 160.0 lb

## 2022-07-26 DIAGNOSIS — O34219 Maternal care for unspecified type scar from previous cesarean delivery: Secondary | ICD-10-CM

## 2022-07-26 DIAGNOSIS — O0990 Supervision of high risk pregnancy, unspecified, unspecified trimester: Secondary | ICD-10-CM

## 2022-07-26 DIAGNOSIS — O24414 Gestational diabetes mellitus in pregnancy, insulin controlled: Secondary | ICD-10-CM

## 2022-07-26 DIAGNOSIS — Z3A27 27 weeks gestation of pregnancy: Secondary | ICD-10-CM

## 2022-07-26 DIAGNOSIS — O09522 Supervision of elderly multigravida, second trimester: Secondary | ICD-10-CM

## 2022-07-26 NOTE — Progress Notes (Signed)
PRENATAL VISIT NOTE  Subjective:  Jenna Mayo is a 47 y.o. G3P2002 at [redacted]w[redacted]d being seen today for ongoing prenatal care.  She is currently monitored for the following issues for this high-risk pregnancy and has Supervision of high risk pregnancy, antepartum; Previous cesarean section x 2 complicating pregnancy; Pregnancy resulting from in vitro fertilization, antepartum; Prediabetes; Diabetes mellitus during pregnancy in second trimester; AMA (advanced maternal age) multigravida 58+; Obesity in pregnancy; and BMI 31.0-31.9,adult on their problem list.  Patient reports no complaints.  Contractions: Not present. Vag. Bleeding: None.  Movement: Present. Denies leaking of fluid.   The following portions of the patient's history were reviewed and updated as appropriate: allergies, current medications, past family history, past medical history, past social history, past surgical history and problem list.   Objective:   Vitals:   07/26/22 1618  BP: 129/77  Pulse: 98  Weight: 160 lb (72.6 kg)    Fetal Status: Fetal Heart Rate (bpm): 148   Movement: Present     General:  Alert, oriented and cooperative. Patient is in no acute distress.  Skin: Skin is warm and dry. No rash noted.   Cardiovascular: Normal heart rate noted  Respiratory: Normal respiratory effort, no problems with respiration noted  Abdomen: Soft, gravid, appropriate for gestational age.  Pain/Pressure: Absent     Pelvic: Cervical exam deferred        Extremities: Normal range of motion.  Edema: None  Mental Status: Normal mood and affect. Normal behavior. Normal judgment and thought content.   Assessment and Plan:  Pregnancy: G3P2002 at [redacted]w[redacted]d 1. Insulin controlled gestational diabetes mellitus (GDM) in second trimester 07/25/22 ultrasound at [facility] at [redacted]w[redacted]d EFW 45%, normal AFV. Patient did not bring log but reports fasting BS 90s and PPs 110-120s on NPH 10 qhs.  Emphasized importance of bringing log to  all visits.  Discussed that she does have a high risk pregnancy and it is preferred that her scans are done by MFM, but she declined MFM scans due to cost. She was told to consider applying for [facility] financial assistance.  For now, she will continue monthly growth scans at [facility] and plans to do weekly BPPs starting at 32 weeks here at [facility].  2. Previous cesarean section x 2 complicating pregnancy She desires TOLAC after C/S x 2. Discussed about risks, copy of VBAC consent given to her to review at home. Will sign at a later visit. Emphasized that the risks quoted are for one cesarean section  3. Multigravida of advanced maternal age in second trimester Continue scheduled growth scans and already planned for antenatal testing.  4. [redacted] weeks gestation of pregnancy 5. Supervision of high risk pregnancy, antepartum Third trimester labs next visit (does not need GTT of course). Preterm labor symptoms and general obstetric precautions including but not limited to vaginal bleeding, contractions, leaking of fluid and fetal movement were reviewed in detail with the patient. Please refer to After Visit Summary for other counseling recommendations.   Return in about 2 weeks (around 08/09/2022) for OFFICE OB VISIT (Jenna Mayo), 3rd trimester labs (does not need GTT, already has DM, needs the other labs).  Future Appointments  Date Time Provider [place]  08/11/2022  4:10 PM Jenna Halim, Jenna Mayo [facility]  08/25/2022  2:50 PM Jenna Mayo, Jenna Havers, Jenna Mayo [facility]  09/07/2022  4:10 PM Jenna Jude, Jenna Mayo [facility]  09/22/2022  4:10 PM Jenna Mayo, Jenna Havers, Jenna Mayo [facility]    Jenna Schneiders, Jenna Mayo

## 2022-08-11 ENCOUNTER — Ambulatory Visit (INDEPENDENT_AMBULATORY_CARE_PROVIDER_SITE_OTHER): Payer: Self-pay | Admitting: Obstetrics and Gynecology

## 2022-08-11 VITALS — BP 95/59 | HR 85 | Wt 159.0 lb

## 2022-08-11 DIAGNOSIS — Z6831 Body mass index (BMI) 31.0-31.9, adult: Secondary | ICD-10-CM

## 2022-08-11 DIAGNOSIS — O0990 Supervision of high risk pregnancy, unspecified, unspecified trimester: Secondary | ICD-10-CM

## 2022-08-11 DIAGNOSIS — Z3A30 30 weeks gestation of pregnancy: Secondary | ICD-10-CM

## 2022-08-11 DIAGNOSIS — O09523 Supervision of elderly multigravida, third trimester: Secondary | ICD-10-CM

## 2022-08-11 DIAGNOSIS — O34219 Maternal care for unspecified type scar from previous cesarean delivery: Secondary | ICD-10-CM

## 2022-08-11 DIAGNOSIS — O99210 Obesity complicating pregnancy, unspecified trimester: Secondary | ICD-10-CM

## 2022-08-11 DIAGNOSIS — O24419 Gestational diabetes mellitus in pregnancy, unspecified control: Secondary | ICD-10-CM

## 2022-08-11 DIAGNOSIS — O09819 Supervision of pregnancy resulting from assisted reproductive technology, unspecified trimester: Secondary | ICD-10-CM

## 2022-08-11 NOTE — Progress Notes (Signed)
ROB   CC: None    

## 2022-08-11 NOTE — Progress Notes (Signed)
   PRENATAL VISIT NOTE  Subjective:  Jenna Mayo is a 47 y.o. G3P2002 at [redacted]w[redacted]d being seen today for ongoing prenatal care.  She is currently monitored for the following issues for this high-risk pregnancy and has Supervision of high risk pregnancy, antepartum; Previous cesarean section x 2 complicating pregnancy; Pregnancy resulting from in vitro fertilization, antepartum; Prediabetes; Diabetes mellitus during pregnancy in second trimester; AMA (advanced maternal age) multigravida 59+; Obesity in pregnancy; and BMI 31.0-31.9,adult on their problem list.  Patient reports no complaints.  Contractions: Not present. Vag. Bleeding: None.  Movement: Present. Denies leaking of fluid.   The following portions of the patient's history were reviewed and updated as appropriate: allergies, current medications, past family history, past medical history, past social history, past surgical history and problem list.   Objective:   Vitals:   08/11/22 1628  BP: (!) 95/59  Pulse: 85  Weight: 159 lb (72.1 kg)    Fetal Status: Fetal Heart Rate (bpm): 150   Movement: Present     General:  Alert, oriented and cooperative. Patient is in no acute distress.  Skin: Skin is warm and dry. No rash noted.   Cardiovascular: Normal heart rate noted  Respiratory: Normal respiratory effort, no problems with respiration noted  Abdomen: Soft, gravid, appropriate for gestational age.  Pain/Pressure: Absent     Pelvic: Cervical exam deferred        Extremities: Normal range of motion.  Edema: None  Mental Status: Normal mood and affect. Normal behavior. Normal judgment and thought content.   Assessment and Plan:  Pregnancy: G3P2002 at [redacted]w[redacted]d 1. Multigravida of advanced maternal age in third trimester Qmonth growth u/s, qwk testing at 38wks, 39wk delivery Front desk asked to schedule late April rpt u/s with [facility] 3/25: efw 45%, 1122gm, nl ac, afi, anatomy wnl  2. Gestational diabetes mellitus  (GDM) in second trimester, gestational diabetes method of control unspecified Nph 10u qhs. CBG log number  3. [redacted] weeks gestation of pregnancy Needs cbc, hiv, rpr  4. Supervision of high risk pregnancy, antepartum  5. Previous cesarean section x 2 complicating pregnancy D/w her and elects for repeat. 39wk repeat requested. I also d/w her re: BTL. They may want more kids. I told her that salpingectomies may improve IVF rates in the future. Patient to consider  6. Pregnancy resulting from in vitro fertilization, antepartum 3/20 fetal echo wnl  7. Obesity in pregnancy  8. BMI 31.0-31.9,adult  Preterm labor symptoms and general obstetric precautions including but not limited to vaginal bleeding, contractions, leaking of fluid and fetal movement were reviewed in detail with the patient. Please refer to After Visit Summary for other counseling recommendations.   Return in about 2 weeks (around 08/25/2022) for in person, high risk ob, md visit.  Future Appointments  Date Time Provider Department Center  08/25/2022  2:50 PM Tereso Newcomer, MD [facility]  09/07/2022  4:10 PM Reva Bores, MD [facility]  09/22/2022  4:10 PM Anyanwu, Jethro Bastos, MD [facility]    [place] Bing, MD

## 2022-08-25 ENCOUNTER — Encounter: Payer: Self-pay | Admitting: Obstetrics & Gynecology

## 2022-08-25 ENCOUNTER — Ambulatory Visit (INDEPENDENT_AMBULATORY_CARE_PROVIDER_SITE_OTHER): Payer: Self-pay | Admitting: Obstetrics & Gynecology

## 2022-08-25 VITALS — BP 115/76 | HR 86 | Wt 161.0 lb

## 2022-08-25 DIAGNOSIS — O24414 Gestational diabetes mellitus in pregnancy, insulin controlled: Secondary | ICD-10-CM

## 2022-08-25 DIAGNOSIS — Z3A32 32 weeks gestation of pregnancy: Secondary | ICD-10-CM

## 2022-08-25 DIAGNOSIS — O34219 Maternal care for unspecified type scar from previous cesarean delivery: Secondary | ICD-10-CM

## 2022-08-25 DIAGNOSIS — O0990 Supervision of high risk pregnancy, unspecified, unspecified trimester: Secondary | ICD-10-CM

## 2022-08-25 DIAGNOSIS — O09523 Supervision of elderly multigravida, third trimester: Secondary | ICD-10-CM

## 2022-08-25 MED ORDER — INSULIN NPH (HUMAN) (ISOPHANE) 100 UNIT/ML ~~LOC~~ SUSP: 10.0000 [IU] | Freq: Every day | SUBCUTANEOUS | 3 refills | Status: DC

## 2022-08-25 NOTE — Progress Notes (Signed)
   PRENATAL VISIT NOTE  Subjective:  Jenna Mayo is a 47 y.o. G3P2002 at [redacted]w[redacted]d being seen today for ongoing prenatal care.  She is currently monitored for the following issues for this high-risk pregnancy and has Supervision of high risk pregnancy, antepartum; Previous cesarean section x 2 complicating pregnancy; Pregnancy resulting from in vitro fertilization, antepartum; Prediabetes; Diabetes mellitus during pregnancy in second trimester; AMA (advanced maternal age) multigravida 40+; Obesity in pregnancy; and BMI 31.0-31.9,adult on their problem list.  Patient reports no complaints.  Contractions: Not present. Vag. Bleeding: None.  Movement: Present. Denies leaking of fluid.   The following portions of the patient's history were reviewed and updated as appropriate: allergies, current medications, past family history, past medical history, past social history, past surgical history and problem list.   Objective:   Vitals:   08/25/22 1521  BP: 115/76  Pulse: 86  Weight: 161 lb (73 kg)    Fetal Status: Fetal Heart Rate (bpm): 145   Movement: Present     General:  Alert, oriented and cooperative. Patient is in no acute distress.  Skin: Skin is warm and dry. No rash noted.   Cardiovascular: Normal heart rate noted  Respiratory: Normal respiratory effort, no problems with respiration noted  Abdomen: Soft, gravid, appropriate for gestational age.  Pain/Pressure: Absent     Pelvic: Cervical exam deferred        Extremities: Normal range of motion.     Mental Status: Normal mood and affect. Normal behavior. Normal judgment and thought content.   Assessment and Plan:  Pregnancy: G3P2002 at [redacted]w[redacted]d 1. Insulin controlled diabetes mellitus in third trimester Good CBGs reported, did not bring log. Insulin refilled. A1C to be checked today. Awaiting 08/19/22 growth scan report from [facility], 08/2022 growth scan scheduled. Will start weekly BPPs  here next week. - insulin NPH  Human (NOVOLIN N) 100 UNIT/ML injection; Inject 0.1 mLs (10 Units total) into the skin at bedtime.  Dispense: 10 mL; Refill: 3 - Hemoglobin A1c  2. Previous cesarean section x 2 complicating pregnancy Already scheduled for RCS on 6/13, patient prefers 6/14. Message sent to surgical scheduler.  Understands that she can deliver earlier if she goes into labor or for any significant maternal-fetal concerns.  3. Multigravida of advanced maternal age in third trimester 4. [redacted] weeks gestation of pregnancy 5. Supervision of high risk pregnancy, antepartum Third trimester labs done today, will follow up results and manage accordingly. Letter given to receive Tdap at [facility] Department. - CBC - RPR - HIV Antibody (routine testing w rflx) Preterm labor symptoms and general obstetric precautions including but not limited to vaginal bleeding, contractions, leaking of fluid and fetal movement were reviewed in detail with the patient. Please refer to After Visit Summary for other counseling recommendations.   Return in about 6 days (around 08/31/2022) for OB visits and antenatal testing as scheduled.  Future Appointments  Date Time Provider Department Center  09/07/2022  4:10 PM Reva Bores, MD [facility]  09/22/2022  4:10 PM Ressie Slevin, Jethro Bastos, MD [facility]    Jaynie Collins, MD

## 2022-08-25 NOTE — Patient Instructions (Signed)
Return to office for any scheduled appointments. Call the office or go to the [facility] at [facility] [place] if: You begin to have strong, frequent contractions Your water breaks.  Sometimes it is a big gush of fluid, sometimes it is just a trickle that keeps getting your underwear wet or running down your legs You have vaginal bleeding.  It is normal to have a small amount of spotting if your cervix was checked.  You do not feel your baby moving like normal.  If you do not, get something to eat and drink and lay down and focus on feeling your baby move.   If your baby is still not moving like normal, you should call the office or go to MAU. Any other obstetric concerns.  

## 2022-08-26 LAB — HEMOGLOBIN A1C
Est. average glucose Bld gHb Est-mCnc: 126 mg/dL
Hgb A1c MFr Bld: 6 % — ABNORMAL HIGH (ref 4.8–5.6)

## 2022-08-26 LAB — CBC
Hematocrit: 33.3 % — ABNORMAL LOW (ref 34.0–46.6)
Hemoglobin: 11.3 g/dL (ref 11.1–15.9)
MCH: 28.3 pg (ref 26.6–33.0)
MCHC: 33.9 g/dL (ref 31.5–35.7)
MCV: 84 fL (ref 79–97)
Platelets: 304 10*3/uL (ref 150–450)
RBC: 3.99 x10E6/uL (ref 3.77–5.28)
RDW: 14.5 % (ref 11.7–15.4)
WBC: 7.4 10*3/uL (ref 3.4–10.8)

## 2022-08-26 LAB — HIV ANTIBODY (ROUTINE TESTING W REFLEX): HIV Screen 4th Generation wRfx: NONREACTIVE

## 2022-08-26 LAB — RPR: RPR Ser Ql: NONREACTIVE

## 2022-08-29 ENCOUNTER — Other Ambulatory Visit: Payer: Self-pay | Admitting: Obstetrics and Gynecology

## 2022-08-29 DIAGNOSIS — O0990 Supervision of high risk pregnancy, unspecified, unspecified trimester: Secondary | ICD-10-CM

## 2022-08-31 ENCOUNTER — Other Ambulatory Visit (INDEPENDENT_AMBULATORY_CARE_PROVIDER_SITE_OTHER): Payer: Self-pay

## 2022-08-31 ENCOUNTER — Ambulatory Visit (INDEPENDENT_AMBULATORY_CARE_PROVIDER_SITE_OTHER): Payer: Self-pay | Admitting: *Deleted

## 2022-08-31 VITALS — BP 120/80 | HR 118 | Wt 162.0 lb

## 2022-08-31 DIAGNOSIS — O0990 Supervision of high risk pregnancy, unspecified, unspecified trimester: Secondary | ICD-10-CM

## 2022-08-31 DIAGNOSIS — Z3A32 32 weeks gestation of pregnancy: Secondary | ICD-10-CM

## 2022-08-31 DIAGNOSIS — O24414 Gestational diabetes mellitus in pregnancy, insulin controlled: Secondary | ICD-10-CM

## 2022-08-31 DIAGNOSIS — Z3A33 33 weeks gestation of pregnancy: Secondary | ICD-10-CM

## 2022-08-31 NOTE — Progress Notes (Signed)
Patient informed that the ultrasound is considered a limited obstetric ultrasound and is not intended to be a complete ultrasound exam.  Patient also informed that the ultrasound is not being completed with the intent of assessing for fetal or placental anomalies or any pelvic abnormalities. Explained that the purpose of today's ultrasound is to assess for fetal well being.  Patient acknowledges the purpose of the exam and the limitations of the study.      Gordan Grell, RN      

## 2022-09-07 ENCOUNTER — Other Ambulatory Visit (INDEPENDENT_AMBULATORY_CARE_PROVIDER_SITE_OTHER): Payer: Self-pay

## 2022-09-07 ENCOUNTER — Ambulatory Visit (INDEPENDENT_AMBULATORY_CARE_PROVIDER_SITE_OTHER): Payer: Self-pay | Admitting: Family Medicine

## 2022-09-07 ENCOUNTER — Encounter: Payer: Self-pay | Admitting: Family Medicine

## 2022-09-07 ENCOUNTER — Ambulatory Visit (INDEPENDENT_AMBULATORY_CARE_PROVIDER_SITE_OTHER): Payer: Self-pay | Admitting: *Deleted

## 2022-09-07 VITALS — BP 116/73 | HR 82 | Wt 163.0 lb

## 2022-09-07 DIAGNOSIS — O0990 Supervision of high risk pregnancy, unspecified, unspecified trimester: Secondary | ICD-10-CM

## 2022-09-07 DIAGNOSIS — O24414 Gestational diabetes mellitus in pregnancy, insulin controlled: Secondary | ICD-10-CM

## 2022-09-07 DIAGNOSIS — O09523 Supervision of elderly multigravida, third trimester: Secondary | ICD-10-CM

## 2022-09-07 DIAGNOSIS — O34219 Maternal care for unspecified type scar from previous cesarean delivery: Secondary | ICD-10-CM

## 2022-09-07 DIAGNOSIS — Z3A34 34 weeks gestation of pregnancy: Secondary | ICD-10-CM

## 2022-09-07 DIAGNOSIS — O09819 Supervision of pregnancy resulting from assisted reproductive technology, unspecified trimester: Secondary | ICD-10-CM

## 2022-09-07 DIAGNOSIS — Z3A33 33 weeks gestation of pregnancy: Secondary | ICD-10-CM

## 2022-09-07 MED ORDER — INSULIN NPH (HUMAN) (ISOPHANE) 100 UNIT/ML ~~LOC~~ SUSP: 12.0000 [IU] | Freq: Every day | SUBCUTANEOUS | 3 refills | Status: DC

## 2022-09-07 NOTE — Progress Notes (Signed)
   PRENATAL VISIT NOTE  Subjective:  Jenna Mayo is a 47 y.o. G3P2002 at [redacted]w[redacted]d being seen today for ongoing prenatal care.  She is currently monitored for the following issues for this high-risk pregnancy and has Supervision of high risk pregnancy, antepartum; Previous cesarean section x 2 complicating pregnancy; Pregnancy resulting from in vitro fertilization, antepartum; Prediabetes; Diabetes mellitus during pregnancy in second trimester; AMA (advanced maternal age) multigravida 71+; Obesity in pregnancy; and BMI 31.0-31.9,adult on their problem list.  Patient reports no complaints.  Contractions: Not present. Vag. Bleeding: None.  Movement: Present. Denies leaking of fluid.   The following portions of the patient's history were reviewed and updated as appropriate: allergies, current medications, past family history, past medical history, past social history, past surgical history and problem list.   Objective:   Vitals:   09/07/22 1512  BP: 116/73  Pulse: 82  Weight: 163 lb (73.9 kg)    Fetal Status: Fetal Heart Rate (bpm): NST   Movement: Present     General:  Alert, oriented and cooperative. Patient is in no acute distress.  Skin: Skin is warm and dry. No rash noted.   Cardiovascular: Normal heart rate noted  Respiratory: Normal respiratory effort, no problems with respiration noted  Abdomen: Soft, gravid, appropriate for gestational age.  Pain/Pressure: Present     Pelvic: Cervical exam deferred        Extremities: Normal range of motion.  Edema: Trace  Mental Status: Normal mood and affect. Normal behavior. Normal judgment and thought content.  NST:  Baseline: 145 bpm, Variability: Good {> 6 bpm), Accelerations: Reactive, and Decelerations: Absent  Assessment and Plan:  Pregnancy: G3P2002 at [redacted]w[redacted]d 1. Supervision of high risk pregnancy, antepartum   2. Insulin controlled gestational diabetes mellitus (GDM) in second trimester No book Patient  reports FBS 95-98-100 2 hour 110s Increase NPH to 12 u q hs - insulin NPH Human (NOVOLIN N) 100 UNIT/ML injection; Inject 0.12 mLs (12 Units total) into the skin at bedtime.  Dispense: 10 mL; Refill: 3  3. Previous cesarean section x 2 complicating pregnancy Scheduled for RCS  4. Pregnancy resulting from in vitro fertilization, antepartum In antenatal testing  5. Multigravida of advanced maternal age in third trimester In testing, last growth at [facility] is ok   Preterm labor symptoms and general obstetric precautions including but not limited to vaginal bleeding, contractions, leaking of fluid and fetal movement were reviewed in detail with the patient. Please refer to After Visit Summary for other counseling recommendations.   Return in 1 week (on 09/14/2022).  Future Appointments  Date Time Provider Department Center  09/14/2022  3:10 PM [facility]  09/21/2022  3:10 PM [facility] NST [facility]  09/22/2022  4:10 PM Anyanwu, Jethro Bastos, MD [facility]  09/28/2022  3:10 PM [facility] NST [facility]  10/05/2022  3:10 PM [facility]  10/12/2022  3:10 PM [facility] NST [facility]    Reva Bores, MD

## 2022-09-07 NOTE — Progress Notes (Signed)
ROB   CC: None   Pt has blood sugar log. Reports sugars have been fine.

## 2022-09-07 NOTE — Progress Notes (Signed)
Patient informed that the ultrasound is considered a limited obstetric ultrasound and is not intended to be a complete ultrasound exam.  Patient also informed that the ultrasound is not being completed with the intent of assessing for fetal or placental anomalies or any pelvic abnormalities. Explained that the purpose of today's ultrasound is to assess for fetal well being.  Patient acknowledges the purpose of the exam and the limitations of the study.      Copper Basnett, RN      

## 2022-09-14 ENCOUNTER — Ambulatory Visit (INDEPENDENT_AMBULATORY_CARE_PROVIDER_SITE_OTHER): Payer: Self-pay | Admitting: *Deleted

## 2022-09-14 ENCOUNTER — Other Ambulatory Visit (INDEPENDENT_AMBULATORY_CARE_PROVIDER_SITE_OTHER): Payer: Self-pay

## 2022-09-14 VITALS — BP 122/80 | HR 88 | Wt 164.0 lb

## 2022-09-14 DIAGNOSIS — O0990 Supervision of high risk pregnancy, unspecified, unspecified trimester: Secondary | ICD-10-CM

## 2022-09-14 DIAGNOSIS — Z3A34 34 weeks gestation of pregnancy: Secondary | ICD-10-CM

## 2022-09-14 DIAGNOSIS — O24414 Gestational diabetes mellitus in pregnancy, insulin controlled: Secondary | ICD-10-CM

## 2022-09-14 NOTE — Progress Notes (Signed)
Patient informed that the ultrasound is considered a limited obstetric ultrasound and is not intended to be a complete ultrasound exam.  Patient also informed that the ultrasound is not being completed with the intent of assessing for fetal or placental anomalies or any pelvic abnormalities. Explained that the purpose of today's ultrasound is to assess for fetal well being.  Patient acknowledges the purpose of the exam and the limitations of the study.      Aedon Deason, RN      

## 2022-09-16 ENCOUNTER — Ambulatory Visit (INDEPENDENT_AMBULATORY_CARE_PROVIDER_SITE_OTHER): Payer: Self-pay | Admitting: *Deleted

## 2022-09-16 DIAGNOSIS — O09819 Supervision of pregnancy resulting from assisted reproductive technology, unspecified trimester: Secondary | ICD-10-CM

## 2022-09-16 DIAGNOSIS — O368131 Decreased fetal movements, third trimester, fetus 1: Secondary | ICD-10-CM

## 2022-09-16 DIAGNOSIS — Z3A35 35 weeks gestation of pregnancy: Secondary | ICD-10-CM

## 2022-09-16 DIAGNOSIS — O0990 Supervision of high risk pregnancy, unspecified, unspecified trimester: Secondary | ICD-10-CM

## 2022-09-16 NOTE — Patient Instructions (Signed)
Fetal Movement Counts Patient Name: ________________________________________________ Patient Due Date: ____________________ What is a fetal movement count?  A fetal movement count is the number of times that you feel your baby move during a certain amount of time. This may also be called a fetal kick count. A fetal movement count is recommended for every pregnant woman. You may be asked to start counting fetal movements as early as week 28 of your pregnancy. Pay attention to when your baby is most active. You may notice your baby's sleep and wake cycles. You may also notice things that make your baby move more. You should do a fetal movement count: When your baby is normally most active. At the same time each day. A good time to count movements is while you are resting, after having something to eat and drink. How do I count fetal movements? Find a quiet, comfortable area. Sit, or lie down on your side. Write down the date, the start time and stop time, and the number of movements that you felt between those two times. Take this information with you to your health care visits. Write down your start time when you feel the first movement. Count kicks, flutters, swishes, rolls, and jabs. You should feel at least 10 movements. You may stop counting after you have felt 10 movements, or if you have been counting for 2 hours. Write down the stop time. If you do not feel 10 movements in 2 hours, contact your health care provider for further instructions. Your health care provider may want to do additional tests to assess your baby's well-being. Contact a health care provider if: You feel fewer than 10 movements in 2 hours. Your baby is not moving like he or she usually does. Date: ____________ Start time: ____________ Stop time: ____________ Movements: ____________ Date: ____________ Start time: ____________ Stop time: ____________ Movements: ____________ Date: ____________ Start time: ____________ Stop  time: ____________ Movements: ____________ Date: ____________ Start time: ____________ Stop time: ____________ Movements: ____________ Date: ____________ Start time: ____________ Stop time: ____________ Movements: ____________ Date: ____________ Start time: ____________ Stop time: ____________ Movements: ____________ Date: ____________ Start time: ____________ Stop time: ____________ Movements: ____________ Date: ____________ Start time: ____________ Stop time: ____________ Movements: ____________ Date: ____________ Start time: ____________ Stop time: ____________ Movements: ____________ This information is not intended to replace advice given to you by your health care provider. Make sure you discuss any questions you have with your health care provider. Document Revised: 12/06/2018 Document Reviewed: 12/06/2018 Elsevier Patient Education  2023 [employer]  

## 2022-09-16 NOTE — Progress Notes (Addendum)
Pt here due to having decreased fetal movement. She last felt baby move around 4am, and she tried cold drinks, caffeine and was not able to get him to move this morning.   Place on NST  FHR reactive and reassuring, kick count information given. Pt to follow up at [facility] next week.   Scheryl Marten, RN   NST performed today was reviewed and was found to be reactive.  Continue recommended antenatal testing and prenatal care.   Jaynie Collins, MD

## 2022-09-21 ENCOUNTER — Ambulatory Visit (INDEPENDENT_AMBULATORY_CARE_PROVIDER_SITE_OTHER): Payer: Self-pay | Admitting: *Deleted

## 2022-09-21 ENCOUNTER — Ambulatory Visit (INDEPENDENT_AMBULATORY_CARE_PROVIDER_SITE_OTHER): Payer: Self-pay | Admitting: Family Medicine

## 2022-09-21 ENCOUNTER — Ambulatory Visit (INDEPENDENT_AMBULATORY_CARE_PROVIDER_SITE_OTHER): Payer: Self-pay

## 2022-09-21 VITALS — BP 112/74 | HR 82 | Wt 164.0 lb

## 2022-09-21 DIAGNOSIS — O34219 Maternal care for unspecified type scar from previous cesarean delivery: Secondary | ICD-10-CM

## 2022-09-21 DIAGNOSIS — O24414 Gestational diabetes mellitus in pregnancy, insulin controlled: Secondary | ICD-10-CM

## 2022-09-21 DIAGNOSIS — O0990 Supervision of high risk pregnancy, unspecified, unspecified trimester: Secondary | ICD-10-CM

## 2022-09-21 DIAGNOSIS — O09819 Supervision of pregnancy resulting from assisted reproductive technology, unspecified trimester: Secondary | ICD-10-CM

## 2022-09-21 DIAGNOSIS — Z3A35 35 weeks gestation of pregnancy: Secondary | ICD-10-CM

## 2022-09-21 DIAGNOSIS — O09523 Supervision of elderly multigravida, third trimester: Secondary | ICD-10-CM

## 2022-09-21 NOTE — Progress Notes (Addendum)
   PRENATAL VISIT NOTE  Subjective:  Jenna Mayo is a 47 y.o. G3P2002 at [redacted]w[redacted]d being seen today for ongoing prenatal care.  She is currently monitored for the following issues for this high-risk pregnancy and has Supervision of high risk pregnancy, antepartum; Previous cesarean section x 2 complicating pregnancy; Pregnancy resulting from in vitro fertilization, antepartum; Prediabetes; Diabetes mellitus during pregnancy in second trimester; AMA (advanced maternal age) multigravida 54+; Obesity in pregnancy; and BMI 31.0-31.9,adult on their problem list.  Patient reports no complaints.   .  .   . Denies leaking of fluid.   The following portions of the patient's history were reviewed and updated as appropriate: allergies, current medications, past family history, past medical history, past social history, past surgical history and problem list.   Objective:  There were no vitals filed for this visit.  Fetal Status:           General:  Alert, oriented and cooperative. Patient is in no acute distress.  Skin: Skin is warm and dry. No rash noted.   Cardiovascular: Normal heart rate noted  Respiratory: Normal respiratory effort, no problems with respiration noted  Abdomen: Soft, gravid, appropriate for gestational age.        Pelvic: Cervical exam deferred        Extremities: Normal range of motion.     Mental Status: Normal mood and affect. Normal behavior. Normal judgment and thought content.   Assessment and Plan:  Pregnancy: G3P2002 at [redacted]w[redacted]d 1. Supervision of high risk pregnancy, antepartum Continue prenatal care.  2. Insulin controlled gestational diabetes mellitus (GDM) in second trimester No book. FBS in the < 95 range since increasing her hs insulin On ASA 2 hour pp < 95 BPP  10/10 NST:  Baseline: 140 bpm, Variability: Good {> 6 bpm), Accelerations: Reactive, and Decelerations: Absent   3. Previous cesarean section x 2 complicating pregnancy Scheduled for  RCS  4. Pregnancy resulting from in vitro fertilization, antepartum Normal growth recently 50%  5. Multigravida of advanced maternal age in third trimester Genetics WNL  Preterm labor symptoms and general obstetric precautions including but not limited to vaginal bleeding, contractions, leaking of fluid and fetal movement were reviewed in detail with the patient. Please refer to After Visit Summary for other counseling recommendations.   Return in 1 week (on 09/28/2022).  Future Appointments  Date Time Provider Department Center  09/28/2022  3:10 PM [facility] NST [facility]  10/05/2022  3:10 PM [facility] NST [facility]  10/12/2022  3:10 PM [facility] N[facility]    Reva Bores, MD

## 2022-09-21 NOTE — Progress Notes (Signed)
Patient informed that the ultrasound is considered a limited obstetric ultrasound and is not intended to be a complete ultrasound exam.  Patient also informed that the ultrasound is not being completed with the intent of assessing for fetal or placental anomalies or any pelvic abnormalities. Explained that the purpose of today's ultrasound is to assess for fetal well being.  Patient acknowledges the purpose of the exam and the limitations of the study.      Anna Beaird, RN      

## 2022-09-22 ENCOUNTER — Encounter: Payer: Self-pay | Admitting: Obstetrics & Gynecology

## 2022-09-28 ENCOUNTER — Other Ambulatory Visit (HOSPITAL_COMMUNITY)
Admission: RE | Admit: 2022-09-28 | Discharge: 2022-09-28 | Disposition: A | Discharge status: Home / Self Care | Payer: Self-pay | Source: Ambulatory Visit | Attending: Family Medicine | Admitting: Family Medicine

## 2022-09-28 ENCOUNTER — Ambulatory Visit (INDEPENDENT_AMBULATORY_CARE_PROVIDER_SITE_OTHER): Payer: Self-pay | Admitting: Family Medicine

## 2022-09-28 ENCOUNTER — Other Ambulatory Visit (INDEPENDENT_AMBULATORY_CARE_PROVIDER_SITE_OTHER): Payer: Self-pay

## 2022-09-28 ENCOUNTER — Ambulatory Visit (INDEPENDENT_AMBULATORY_CARE_PROVIDER_SITE_OTHER): Payer: Self-pay | Admitting: *Deleted

## 2022-09-28 VITALS — BP 129/81 | HR 91 | Wt 164.0 lb

## 2022-09-28 DIAGNOSIS — O0990 Supervision of high risk pregnancy, unspecified, unspecified trimester: Secondary | ICD-10-CM | POA: Insufficient documentation

## 2022-09-28 DIAGNOSIS — Z3A36 36 weeks gestation of pregnancy: Secondary | ICD-10-CM

## 2022-09-28 DIAGNOSIS — O24414 Gestational diabetes mellitus in pregnancy, insulin controlled: Secondary | ICD-10-CM

## 2022-09-28 DIAGNOSIS — O09523 Supervision of elderly multigravida, third trimester: Secondary | ICD-10-CM

## 2022-09-28 DIAGNOSIS — O34219 Maternal care for unspecified type scar from previous cesarean delivery: Secondary | ICD-10-CM

## 2022-09-28 NOTE — Progress Notes (Signed)
   PRENATAL VISIT NOTE  Subjective:  Jenna Mayo is a 47 y.o. G3P2002 at [redacted]w[redacted]d being seen today for ongoing prenatal care.  She is currently monitored for the following issues for this high-risk pregnancy and has Supervision of high risk pregnancy, antepartum; Previous cesarean section x 2 complicating pregnancy; Pregnancy resulting from in vitro fertilization, antepartum; Prediabetes; Diabetes mellitus during pregnancy in second trimester; AMA (advanced maternal age) multigravida 44+; Obesity in pregnancy; and BMI 31.0-31.9,adult on their problem list.  Patient reports no complaints.   .  .   . Denies leaking of fluid.   The following portions of the patient's history were reviewed and updated as appropriate: allergies, current medications, past family history, past medical history, past social history, past surgical history and problem list.   Objective:  There were no vitals filed for this visit.  Fetal Status:           General:  Alert, oriented and cooperative. Patient is in no acute distress.  Skin: Skin is warm and dry. No rash noted.   Cardiovascular: Normal heart rate noted  Respiratory: Normal respiratory effort, no problems with respiration noted  Abdomen: Soft, gravid, appropriate for gestational age.        Pelvic: Cervical exam performed in the presence of a chaperone Dilation: Fingertip Effacement (%): 80, 70    Extremities: Normal range of motion.     Mental Status: Normal mood and affect. Normal behavior. Normal judgment and thought content.   Assessment and Plan:  Pregnancy: G3P2002 at [redacted]w[redacted]d 1. Supervision of high risk pregnancy, antepartum Cultures today - Cervicovaginal ancillary only - Strep Gp B NAA  2. Multigravida of advanced maternal age in third trimester Genetics WNL  3. Previous cesarean section x 2 complicating pregnancy For RCS  4. Insulin controlled gestational diabetes mellitus (GDM) in second trimester No log-- Recent growth  ok 50%  Preterm labor symptoms and general obstetric precautions including but not limited to vaginal bleeding, contractions, leaking of fluid and fetal movement were reviewed in detail with the patient. Please refer to After Visit Summary for other counseling recommendations.   Return in 1 week (on 10/05/2022).  Future Appointments  Date Time Provider Department Center  10/05/2022  3:10 PM [facility] NST [facility]  10/05/2022  4:10 PM Reva Bores, MD [facility]  10/12/2022  3:10 PM [facility] NST [facility]  10/12/2022  4:10 PM [place] Bing, MD [facility]    Reva Bores, MD

## 2022-09-28 NOTE — Progress Notes (Signed)
Patient informed that the ultrasound is considered a limited obstetric ultrasound and is not intended to be a complete ultrasound exam.  Patient also informed that the ultrasound is not being completed with the intent of assessing for fetal or placental anomalies or any pelvic abnormalities. Explained that the purpose of today's ultrasound is to assess for fetal well being.  Patient acknowledges the purpose of the exam and the limitations of the study.      Kentrell Hallahan, RN      

## 2022-09-30 LAB — CERVICOVAGINAL ANCILLARY ONLY
Chlamydia: NEGATIVE
Comment: NEGATIVE
Comment: NORMAL
Neisseria Gonorrhea: NEGATIVE

## 2022-09-30 LAB — STREP GP B NAA: Strep Gp B NAA: NEGATIVE

## 2022-10-03 ENCOUNTER — Encounter (HOSPITAL_COMMUNITY): Payer: Self-pay

## 2022-10-03 NOTE — Patient Instructions (Signed)
Jenna Mayo  10/03/2022   Your procedure is scheduled on:  10/14/2022  Arrive at 0730 at Entrance C on [address] at [facility]  and [employer]. You are invited to use the FREE valet parking or use the Visitor's parking deck.  Pick up the phone at the desk and dial [phone].  Call this number if you have problems the morning of surgery: [phone]  Remember:   Do not eat food:(After Midnight) Desps de medianoche.  Do not drink clear liquids: (After Midnight) Desps de medianoche.  Take these medicines the morning of surgery with A SIP OF WATER:  Take half of the prescribed dose of insulin the night before surgery and no insulin on day of surgery   Do not wear jewelry, make-up or nail polish.  Do not wear lotions, powders, or perfumes. Do not wear deodorant.  Do not shave 48 hours prior to surgery.  Do not bring valuables to the hospital.  [facility] is not   responsible for any belongings or valuables brought to the hospital.  Contacts, dentures or bridgework may not be worn into surgery.  Leave suitcase in the car. After surgery it may be brought to your room.  For patients admitted to the hospital, checkout time is 11:00 AM the day of              discharge.      Please read over the following fact sheets that you were given:     Preparing for Surgery

## 2022-10-05 ENCOUNTER — Ambulatory Visit (INDEPENDENT_AMBULATORY_CARE_PROVIDER_SITE_OTHER): Payer: Self-pay | Admitting: Family Medicine

## 2022-10-05 ENCOUNTER — Ambulatory Visit (INDEPENDENT_AMBULATORY_CARE_PROVIDER_SITE_OTHER): Payer: Self-pay

## 2022-10-05 ENCOUNTER — Ambulatory Visit: Payer: Self-pay | Admitting: *Deleted

## 2022-10-05 VITALS — BP 108/65 | HR 83

## 2022-10-05 DIAGNOSIS — O0990 Supervision of high risk pregnancy, unspecified, unspecified trimester: Secondary | ICD-10-CM

## 2022-10-05 DIAGNOSIS — O09819 Supervision of pregnancy resulting from assisted reproductive technology, unspecified trimester: Secondary | ICD-10-CM

## 2022-10-05 DIAGNOSIS — O24414 Gestational diabetes mellitus in pregnancy, insulin controlled: Secondary | ICD-10-CM

## 2022-10-05 DIAGNOSIS — Z3A37 37 weeks gestation of pregnancy: Secondary | ICD-10-CM

## 2022-10-05 DIAGNOSIS — O34219 Maternal care for unspecified type scar from previous cesarean delivery: Secondary | ICD-10-CM

## 2022-10-05 DIAGNOSIS — O09523 Supervision of elderly multigravida, third trimester: Secondary | ICD-10-CM

## 2022-10-05 NOTE — Progress Notes (Signed)
Patient informed that the ultrasound is considered a limited obstetric ultrasound and is not intended to be a complete ultrasound exam.  Patient also informed that the ultrasound is not being completed with the intent of assessing for fetal or placental anomalies or any pelvic abnormalities. Explained that the purpose of today's ultrasound is to assess for fetal well being.  Patient acknowledges the purpose of the exam and the limitations of the study.      Beena Catano, RN      

## 2022-10-05 NOTE — Progress Notes (Unsigned)
   PRENATAL VISIT NOTE  Subjective:  Jenna Mayo is a 47 y.o. G3P2002 at [redacted]w[redacted]d being seen today for ongoing prenatal care.  She is currently monitored for the following issues for this high-risk pregnancy and has Supervision of high risk pregnancy, antepartum; Previous cesarean section x 2 complicating pregnancy; Pregnancy resulting from in vitro fertilization, antepartum; Prediabetes; Diabetes mellitus during pregnancy in second trimester; AMA (advanced maternal age) multigravida 66+; Obesity in pregnancy; and BMI 31.0-31.9,adult on their problem list.  Patient reports no complaints.   .  .   . Denies leaking of fluid.   The following portions of the patient's history were reviewed and updated as appropriate: allergies, current medications, past family history, past medical history, past social history, past surgical history and problem list.   Objective:  There were no vitals filed for this visit.  Fetal Status:           General:  Alert, oriented and cooperative. Patient is in no acute distress.  Skin: Skin is warm and dry. No rash noted.   Cardiovascular: Normal heart rate noted  Respiratory: Normal respiratory effort, no problems with respiration noted  Abdomen: Soft, gravid, appropriate for gestational age.        Pelvic: Cervical exam deferred        Extremities: Normal range of motion.     Mental Status: Normal mood and affect. Normal behavior. Normal judgment and thought content.  NST:  Baseline: 150 bpm, Variability: Good {> 6 bpm), Accelerations: Reactive, and Decelerations: Absent  Assessment and Plan:  Pregnancy: G3P2002 at [redacted]w[redacted]d 1. Insulin controlled gestational diabetes mellitus (GDM) in third trimester No book In testing  2. Supervision of high risk pregnancy, antepartum   3. Previous cesarean section x 2 complicating pregnancy For RCS @ 39 weeks  4. Pregnancy resulting from in vitro fertilization, antepartum In testing  5. Multigravida of  advanced maternal age in third trimester Normal genetics  Preterm labor symptoms and general obstetric precautions including but not limited to vaginal bleeding, contractions, leaking of fluid and fetal movement were reviewed in detail with the patient. Please refer to After Visit Summary for other counseling recommendations.   No follow-ups on file.  Future Appointments  Date Time Provider Department Center  10/12/2022  9:00 AM [facility] PAT 1 [facility] None  10/12/2022  3:10 PM [facility] NST [facility]  10/12/2022  4:10 PM [place] Bing, MD [facility]    Reva Bores, MD

## 2022-10-06 ENCOUNTER — Inpatient Hospital Stay (HOSPITAL_COMMUNITY): Payer: Medicaid Other | Admitting: Anesthesiology

## 2022-10-06 ENCOUNTER — Other Ambulatory Visit: Payer: Self-pay

## 2022-10-06 ENCOUNTER — Encounter (HOSPITAL_COMMUNITY)
Admission: AD | Disposition: A | Discharge status: Home / Self Care | Payer: Self-pay | Source: Home / Self Care | Attending: Family Medicine

## 2022-10-06 ENCOUNTER — Inpatient Hospital Stay (HOSPITAL_COMMUNITY)
Admission: AD | Admit: 2022-10-06 | Discharge: 2022-10-08 | DRG: 788 | Disposition: A | Discharge status: Home / Self Care | Payer: Medicaid Other | Attending: Family Medicine | Admitting: Family Medicine

## 2022-10-06 ENCOUNTER — Ambulatory Visit (INDEPENDENT_AMBULATORY_CARE_PROVIDER_SITE_OTHER): Payer: Self-pay | Admitting: Family Medicine

## 2022-10-06 ENCOUNTER — Encounter (HOSPITAL_COMMUNITY): Payer: Self-pay | Admitting: Obstetrics and Gynecology

## 2022-10-06 VITALS — BP 124/81 | HR 81

## 2022-10-06 DIAGNOSIS — O24919 Unspecified diabetes mellitus in pregnancy, unspecified trimester: Secondary | ICD-10-CM | POA: Diagnosis present

## 2022-10-06 DIAGNOSIS — O9081 Anemia of the puerperium: Secondary | ICD-10-CM | POA: Diagnosis not present

## 2022-10-06 DIAGNOSIS — Z98891 History of uterine scar from previous surgery: Secondary | ICD-10-CM

## 2022-10-06 DIAGNOSIS — Z3A38 38 weeks gestation of pregnancy: Secondary | ICD-10-CM

## 2022-10-06 DIAGNOSIS — O24429 Gestational diabetes mellitus in childbirth, unspecified control: Secondary | ICD-10-CM

## 2022-10-06 DIAGNOSIS — O34211 Maternal care for low transverse scar from previous cesarean delivery: Secondary | ICD-10-CM | POA: Diagnosis present

## 2022-10-06 DIAGNOSIS — O26893 Other specified pregnancy related conditions, third trimester: Secondary | ICD-10-CM | POA: Diagnosis present

## 2022-10-06 DIAGNOSIS — O99214 Obesity complicating childbirth: Secondary | ICD-10-CM | POA: Diagnosis present

## 2022-10-06 DIAGNOSIS — O4292 Full-term premature rupture of membranes, unspecified as to length of time between rupture and onset of labor: Principal | ICD-10-CM | POA: Diagnosis present

## 2022-10-06 DIAGNOSIS — O24424 Gestational diabetes mellitus in childbirth, insulin controlled: Secondary | ICD-10-CM | POA: Diagnosis present

## 2022-10-06 DIAGNOSIS — Z8632 Personal history of gestational diabetes: Secondary | ICD-10-CM | POA: Diagnosis present

## 2022-10-06 DIAGNOSIS — O34219 Maternal care for unspecified type scar from previous cesarean delivery: Secondary | ICD-10-CM | POA: Diagnosis present

## 2022-10-06 DIAGNOSIS — O99210 Obesity complicating pregnancy, unspecified trimester: Secondary | ICD-10-CM | POA: Diagnosis present

## 2022-10-06 DIAGNOSIS — O09523 Supervision of elderly multigravida, third trimester: Secondary | ICD-10-CM

## 2022-10-06 DIAGNOSIS — O0990 Supervision of high risk pregnancy, unspecified, unspecified trimester: Secondary | ICD-10-CM

## 2022-10-06 LAB — CBC
HCT: 37.1 % (ref 36.0–46.0)
Hemoglobin: 12.1 g/dL (ref 12.0–15.0)
MCH: 28 pg (ref 26.0–34.0)
MCHC: 32.6 g/dL (ref 30.0–36.0)
MCV: 85.9 fL (ref 80.0–100.0)
Platelets: 261 10*3/uL (ref 150–400)
RBC: 4.32 MIL/uL (ref 3.87–5.11)
RDW: 15.4 % (ref 11.5–15.5)
WBC: 7.6 10*3/uL (ref 4.0–10.5)
nRBC: 0 % (ref 0.0–0.2)

## 2022-10-06 LAB — TYPE AND SCREEN

## 2022-10-06 LAB — PREPARE RBC (CROSSMATCH)

## 2022-10-06 LAB — GLUCOSE, CAPILLARY: Glucose-Capillary: 74 mg/dL (ref 70–99)

## 2022-10-06 LAB — BPAM RBC: Unit Type and Rh: 6200

## 2022-10-06 SURGERY — Surgical Case
Anesthesia: Epidural

## 2022-10-06 MED ORDER — TETANUS-DIPHTH-ACELL PERTUSSIS 5-2.5-18.5 LF-MCG/0.5 IM SUSY: 0.5000 mL | PREFILLED_SYRINGE | Freq: Once | INTRAMUSCULAR | Status: DC

## 2022-10-06 MED ORDER — ENOXAPARIN SODIUM 40 MG/0.4ML IJ SOSY
40.0000 mg | PREFILLED_SYRINGE | INTRAMUSCULAR | Status: DC
  Administered 2022-10-07 – 2022-10-08 (×2): 40 mg via SUBCUTANEOUS
  Filled 2022-10-06 (×2): qty 0.4

## 2022-10-06 MED ORDER — GABAPENTIN 100 MG PO CAPS
100.0000 mg | ORAL_CAPSULE | Freq: Two times a day (BID) | ORAL | Status: DC
  Administered 2022-10-06 – 2022-10-08 (×4): 100 mg via ORAL
  Filled 2022-10-06 (×4): qty 1

## 2022-10-06 MED ORDER — WITCH HAZEL-GLYCERIN EX PADS: 1.0000 | MEDICATED_PAD | CUTANEOUS | Status: DC | PRN

## 2022-10-06 MED ORDER — PRENATAL MULTIVITAMIN CH
1.0000 | ORAL_TABLET | Freq: Every day | ORAL | Status: DC
  Administered 2022-10-07 – 2022-10-08 (×2): 1 via ORAL
  Filled 2022-10-06 (×2): qty 1

## 2022-10-06 MED ORDER — DIPHENHYDRAMINE HCL 25 MG PO CAPS: 25.0000 mg | ORAL_CAPSULE | Freq: Four times a day (QID) | ORAL | Status: DC | PRN

## 2022-10-06 MED ORDER — CEFAZOLIN SODIUM-DEXTROSE 2-4 GM/100ML-% IV SOLN
2.0000 g | INTRAVENOUS | Status: AC
  Administered 2022-10-06: 2 g via INTRAVENOUS
  Filled 2022-10-06: qty 100

## 2022-10-06 MED ORDER — OXYCODONE HCL 5 MG PO TABS: 5.0000 mg | ORAL_TABLET | Freq: Once | ORAL | Status: DC | PRN

## 2022-10-06 MED ORDER — ONDANSETRON HCL 4 MG/2ML IJ SOLN
INTRAMUSCULAR | Status: DC | PRN
  Administered 2022-10-06: 4 mg via INTRAVENOUS

## 2022-10-06 MED ORDER — MORPHINE SULFATE (PF) 0.5 MG/ML IJ SOLN
INTRAMUSCULAR | Status: DC | PRN
  Administered 2022-10-06: 150 ug via INTRATHECAL

## 2022-10-06 MED ORDER — DEXAMETHASONE SODIUM PHOSPHATE 10 MG/ML IJ SOLN
INTRAMUSCULAR | Status: DC | PRN
  Administered 2022-10-06: 4 mg via INTRAVENOUS

## 2022-10-06 MED ORDER — SIMETHICONE 80 MG PO CHEW
80.0000 mg | CHEWABLE_TABLET | Freq: Three times a day (TID) | ORAL | Status: DC
  Administered 2022-10-07 – 2022-10-08 (×5): 80 mg via ORAL
  Filled 2022-10-06 (×5): qty 1

## 2022-10-06 MED ORDER — KETOROLAC TROMETHAMINE 30 MG/ML IJ SOLN: 30.0000 mg | Freq: Once | INTRAMUSCULAR | Status: DC | PRN

## 2022-10-06 MED ORDER — OXYCODONE HCL 5 MG/5ML PO SOLN: 5.0000 mg | Freq: Once | ORAL | Status: DC | PRN

## 2022-10-06 MED ORDER — ACETAMINOPHEN 500 MG PO TABS
1000.0000 mg | ORAL_TABLET | Freq: Four times a day (QID) | ORAL | Status: DC
  Administered 2022-10-06 – 2022-10-08 (×7): 1000 mg via ORAL
  Filled 2022-10-06 (×7): qty 2

## 2022-10-06 MED ORDER — ONDANSETRON HCL 4 MG/2ML IJ SOLN
INTRAMUSCULAR | Status: AC
  Filled 2022-10-06: qty 2

## 2022-10-06 MED ORDER — PROMETHAZINE HCL 25 MG/ML IJ SOLN: 6.2500 mg | INTRAMUSCULAR | Status: DC | PRN

## 2022-10-06 MED ORDER — COCONUT OIL OIL: 1.0000 | TOPICAL_OIL | Status: DC | PRN

## 2022-10-06 MED ORDER — ACETAMINOPHEN 10 MG/ML IV SOLN: 1000.0000 mg | Freq: Once | INTRAVENOUS | Status: DC | PRN

## 2022-10-06 MED ORDER — FENTANYL CITRATE (PF) 100 MCG/2ML IJ SOLN
INTRAMUSCULAR | Status: AC
  Filled 2022-10-06: qty 2

## 2022-10-06 MED ORDER — OXYCODONE HCL 5 MG PO TABS
5.0000 mg | ORAL_TABLET | ORAL | Status: DC | PRN
  Administered 2022-10-07: 10 mg via ORAL
  Administered 2022-10-07: 5 mg via ORAL
  Administered 2022-10-08 (×2): 10 mg via ORAL
  Filled 2022-10-06 (×3): qty 2
  Filled 2022-10-06: qty 1

## 2022-10-06 MED ORDER — TRANEXAMIC ACID-NACL 1000-0.7 MG/100ML-% IV SOLN
1000.0000 mg | Freq: Once | INTRAVENOUS | Status: AC
  Administered 2022-10-06: 1000 mg via INTRAVENOUS

## 2022-10-06 MED ORDER — SODIUM CHLORIDE 0.9 % IV SOLN
INTRAVENOUS | Status: DC | PRN
  Administered 2022-10-06: 500 mg via INTRAVENOUS

## 2022-10-06 MED ORDER — FENTANYL CITRATE (PF) 100 MCG/2ML IJ SOLN
INTRAMUSCULAR | Status: DC | PRN
  Administered 2022-10-06: 15 ug via INTRATHECAL

## 2022-10-06 MED ORDER — MORPHINE SULFATE (PF) 0.5 MG/ML IJ SOLN
INTRAMUSCULAR | Status: AC
  Filled 2022-10-06: qty 10

## 2022-10-06 MED ORDER — SIMETHICONE 80 MG PO CHEW: 80.0000 mg | CHEWABLE_TABLET | ORAL | Status: DC | PRN

## 2022-10-06 MED ORDER — STERILE WATER FOR IRRIGATION IR SOLN
Status: DC | PRN
  Administered 2022-10-06: 1000 mL

## 2022-10-06 MED ORDER — POVIDONE-IODINE 10 % EX SWAB
2.0000 | Freq: Once | CUTANEOUS | Status: AC
  Administered 2022-10-06: 2 via TOPICAL

## 2022-10-06 MED ORDER — SOD CITRATE-CITRIC ACID 500-334 MG/5ML PO SOLN
30.0000 mL | Freq: Once | ORAL | Status: AC
  Administered 2022-10-06: 30 mL via ORAL
  Filled 2022-10-06: qty 30

## 2022-10-06 MED ORDER — ZOLPIDEM TARTRATE 5 MG PO TABS: 5.0000 mg | ORAL_TABLET | Freq: Every evening | ORAL | Status: DC | PRN

## 2022-10-06 MED ORDER — OXYTOCIN-SODIUM CHLORIDE 30-0.9 UT/500ML-% IV SOLN
2.5000 [IU]/h | INTRAVENOUS | Status: AC
  Administered 2022-10-06: 2.5 [IU]/h via INTRAVENOUS

## 2022-10-06 MED ORDER — DEXAMETHASONE SODIUM PHOSPHATE 4 MG/ML IJ SOLN
INTRAMUSCULAR | Status: AC
  Filled 2022-10-06: qty 1

## 2022-10-06 MED ORDER — SODIUM CHLORIDE 0.9 % IR SOLN
Status: DC | PRN
  Administered 2022-10-06: 1

## 2022-10-06 MED ORDER — DIBUCAINE (PERIANAL) 1 % EX OINT: 1.0000 | TOPICAL_OINTMENT | CUTANEOUS | Status: DC | PRN

## 2022-10-06 MED ORDER — LACTATED RINGERS IV SOLN: INTRAVENOUS | Status: DC | PRN

## 2022-10-06 MED ORDER — KETOROLAC TROMETHAMINE 30 MG/ML IJ SOLN
30.0000 mg | Freq: Four times a day (QID) | INTRAMUSCULAR | Status: AC
  Administered 2022-10-06 – 2022-10-07 (×3): 30 mg via INTRAVENOUS
  Filled 2022-10-06 (×3): qty 1

## 2022-10-06 MED ORDER — BUPIVACAINE IN DEXTROSE 0.75-8.25 % IT SOLN
INTRATHECAL | Status: DC | PRN
  Administered 2022-10-06: 1.4 mg via INTRATHECAL

## 2022-10-06 MED ORDER — OXYTOCIN-SODIUM CHLORIDE 30-0.9 UT/500ML-% IV SOLN
INTRAVENOUS | Status: DC | PRN
  Administered 2022-10-06: 300 mL via INTRAVENOUS

## 2022-10-06 MED ORDER — MENTHOL 3 MG MT LOZG: 1.0000 | LOZENGE | OROMUCOSAL | Status: DC | PRN

## 2022-10-06 MED ORDER — SENNOSIDES-DOCUSATE SODIUM 8.6-50 MG PO TABS
2.0000 | ORAL_TABLET | Freq: Every day | ORAL | Status: DC
  Administered 2022-10-07 – 2022-10-08 (×2): 2 via ORAL
  Filled 2022-10-06 (×2): qty 2

## 2022-10-06 MED ORDER — PHENYLEPHRINE HCL-NACL 20-0.9 MG/250ML-% IV SOLN
INTRAVENOUS | Status: DC | PRN
  Administered 2022-10-06: 60 ug/min via INTRAVENOUS

## 2022-10-06 MED ORDER — ACETAMINOPHEN 10 MG/ML IV SOLN
INTRAVENOUS | Status: DC | PRN
  Administered 2022-10-06: 1000 mg via INTRAVENOUS

## 2022-10-06 MED ORDER — FENTANYL CITRATE (PF) 100 MCG/2ML IJ SOLN: 25.0000 ug | INTRAMUSCULAR | Status: DC | PRN

## 2022-10-06 MED ORDER — IBUPROFEN 600 MG PO TABS
600.0000 mg | ORAL_TABLET | Freq: Four times a day (QID) | ORAL | Status: DC
  Administered 2022-10-07 – 2022-10-08 (×5): 600 mg via ORAL
  Filled 2022-10-06 (×5): qty 1

## 2022-10-06 SURGICAL SUPPLY — 32 items
ADH SKN CLS APL DERMABOND .7 (GAUZE/BANDAGES/DRESSINGS) ×1
APL PRP STRL LF DISP 70% ISPRP (MISCELLANEOUS) ×2
CHLORAPREP W/TINT 26 (MISCELLANEOUS) ×2 IMPLANT
CLAMP UMBILICAL CORD (MISCELLANEOUS) ×1 IMPLANT
CLIP FILSHIE TUBAL LIGA STRL (Clip) IMPLANT
CLOTH BEACON ORANGE TIMEOUT ST (SAFETY) ×1 IMPLANT
DERMABOND ADVANCED .7 DNX12 (GAUZE/BANDAGES/DRESSINGS) ×1 IMPLANT
DRSG OPSITE POSTOP 4X10 (GAUZE/BANDAGES/DRESSINGS) ×1 IMPLANT
ELECT REM PT RETURN 9FT ADLT (ELECTROSURGICAL) ×1
ELECTRODE REM PT RTRN 9FT ADLT (ELECTROSURGICAL) ×1 IMPLANT
EXTRACTOR VACUUM KIWI (MISCELLANEOUS) ×1 IMPLANT
GAUZE SPONGE 4X4 12PLY STRL LF (GAUZE/BANDAGES/DRESSINGS) IMPLANT
GLOVE BIOGEL PI IND STRL 7.0 (GLOVE) ×3 IMPLANT
GLOVE ECLIPSE 6.5 STRL STRAW (GLOVE) ×1 IMPLANT
GOWN STRL REUS W/ TWL LRG LVL3 (GOWN DISPOSABLE) ×2 IMPLANT
GOWN STRL REUS W/TWL LRG LVL3 (GOWN DISPOSABLE) ×2
HEMOSTAT ARISTA ABSORB 3G PWDR (HEMOSTASIS) IMPLANT
NS IRRIG 1000ML POUR BTL (IV SOLUTION) ×1 IMPLANT
PAD OB MATERNITY 4.3X12.25 (PERSONAL CARE ITEMS) ×1 IMPLANT
PAD PREP 24X48 CUFFED NSTRL (MISCELLANEOUS) ×1 IMPLANT
RETRACTOR WND ALEXIS 25 LRG (MISCELLANEOUS) IMPLANT
RTRCTR WOUND ALEXIS 25CM LRG (MISCELLANEOUS)
SUT MNCRL AB 0 CT1 27 (SUTURE) ×1 IMPLANT
SUT PLAIN 2 0 XLH (SUTURE) ×1 IMPLANT
SUT VIC AB 0 CT1 36 (SUTURE) ×1 IMPLANT
SUT VIC AB 2-0 CT1 27 (SUTURE) ×1
SUT VIC AB 2-0 CT1 TAPERPNT 27 (SUTURE) ×1 IMPLANT
SUT VIC AB 4-0 KS 27 (SUTURE) ×1 IMPLANT
TOWEL OR 17X24 6PK STRL BLUE (TOWEL DISPOSABLE) ×3 IMPLANT
TRAY FOLEY W/BAG SLVR 16FR (SET/KITS/TRAYS/PACK) ×1
TRAY FOLEY W/BAG SLVR 16FR ST (SET/KITS/TRAYS/PACK) ×1 IMPLANT
WATER STERILE IRR 1000ML POUR (IV SOLUTION) ×1 IMPLANT

## 2022-10-06 NOTE — MAU Note (Signed)
Patient desires to [facility]. Dr. Alvester Morin notified.

## 2022-10-06 NOTE — MAU Note (Signed)
.  Jenna Mayo is a 47 y.o. at [redacted]w[redacted]d here in MAU reporting: SROM at 0900 that was clear/blood tinge. States she is contracting irregularly and endorses +FM.   Contractions every: 15 minutes Onset of ctx: Yesterday Pain score: 7/10  ROM: SROM   +pooling/+Nitrazine in OB office  Vaginal Bleeding: None   Epidural: repeat c/s   Fetal Movement: Reports positive FM FHT:150 via External  Vitals:   10/06/22 1226 10/06/22 1237  BP: 120/66 116/72  Pulse: 84 77  Resp:    SpO2:         OB Office: Faculty GBS: Negative HSV: Denies hx of HSV Lab orders placed from triage:  Prepare for OR

## 2022-10-06 NOTE — Progress Notes (Signed)
Patient deciding to continue with repeat cesarean. Dr. Alvester Morin consent with patient.

## 2022-10-06 NOTE — Discharge Summary (Signed)
Postpartum Discharge Summary     Patient Name: Quetzal Meany Rosa-Tovar DOB: [birth year] MRN: [id number]  Date of admission: 10/06/2022 Delivery date:10/06/2022  Delivering provider: Lavonda Jumbo  Date of discharge: 10/08/2022  Admitting diagnosis: S/P repeat low transverse C-section [Z98.891] Intrauterine pregnancy: [redacted]w[redacted]d     Secondary diagnosis:  Principal Problem:   S/P repeat low transverse C-section Active Problems:   Previous cesarean section x 2 complicating pregnancy   Diabetes in pregnancy   Obesity in pregnancy  Additional problems: anemia    Discharge diagnosis: Term Pregnancy Delivered                                              Post partum procedures: n/a Augmentation: N/A Complications: None  Hospital course: Onset of Labor With Unplanned C/S   47 y.o. yo G3P3003 at [redacted]w[redacted]d was admitted from [facility] 10/06/2022. Patient declined a vaginal attempt/TOLAC. The patient went for cesarean section due to Elective Repeat. Delivery details as follows: Membrane Rupture Time/Date: 9:00 AM ,10/06/2022   Delivery Method:C-Section, Low Transverse  Details of operation can be found in separate operative note. Patient had a postpartum course complicated by asymptomatic anemia hgb 8.6. She was started on oral iron.  She is ambulating,tolerating a regular diet, passing flatus, and urinating well.  Patient is discharged home in stable condition 10/08/22.  Newborn Data: Birth date:10/06/2022  Birth time:4:02 PM  Gender:Female  Living status:Living  Apgars:9 ,9  Weight:3520 g   Magnesium Sulfate received: No BMZ received: No Rhophylac:No MMR:No T-DaP: Offered at HD Flu: No Transfusion:No  Physical exam  Vitals:   10/07/22 1040 10/07/22 1551 10/07/22 2039 10/08/22 0507  BP: 90/68 107/72 100/60 (!) 109/59  Pulse: 85 76 80 61  Resp: 18 17 16 18   Temp: 98 F (36.7 C) 98.2 F (36.8 C) 98 F (36.7 C) 98.2 F (36.8 C)  TempSrc:  Oral Oral Oral  SpO2: 99% 100% 100% 100%    General: alert, cooperative, and no distress Lochia: appropriate Uterine Fundus: firm Incision: Healing well with no significant drainage, No significant erythema, Dressing is clean, dry, and intact DVT Evaluation: No evidence of DVT seen on physical exam. Negative Homan's sign. No cords or calf tenderness. No significant calf/ankle edema. Labs: Lab Results  Component Value Date   WBC 11.0 (H) 10/07/2022   HGB 8.6 (L) 10/07/2022   HCT 26.5 (L) 10/07/2022   MCV 86.3 10/07/2022   PLT 204 10/07/2022      Latest Ref Rng & Units 09/26/2018    8:54 AM  CMP  Glucose 65 - 99 mg/dL 409   BUN 7 - 25 mg/dL 12   Creatinine 8.11 - 1.10 mg/dL 9.14   Sodium 782 - 956 mmol/L 136   Potassium 3.5 - 5.3 mmol/L 4.8   Chloride 98 - 110 mmol/L 103   CO2 20 - 32 mmol/L 24   Calcium 8.6 - 10.2 mg/dL 9.5   Total Protein 6.1 - 8.1 g/dL 7.1   Total Bilirubin 0.2 - 1.2 mg/dL 0.3   AST 10 - 30 U/L 15   ALT 6 - 29 U/L 17    Edinburgh Score:    10/07/2022   10:41 AM  Edinburgh Postnatal Depression Scale Screening Tool  I have been able to laugh and see the funny side of things. 0  I have looked forward with enjoyment  to things. 0  I have blamed myself unnecessarily when things went wrong. 0  I have been anxious or worried for no good reason. 0  I have felt scared or panicky for no good reason. 0  Things have been getting on top of me. 0  I have been so unhappy that I have had difficulty sleeping. 0  I have felt sad or miserable. 0  I have been so unhappy that I have been crying. 0  The thought of harming myself has occurred to me. 0  Edinburgh Postnatal Depression Scale Total 0     After visit meds:  Allergies as of 10/08/2022   No Known Allergies      Medication List     STOP taking these medications    Accu-Chek Guide test strip Generic drug: glucose blood   Accu-Chek Guide w/Device Kit   Accu-Chek Softclix Lancets lancets   aspirin EC 81 MG tablet   B-12 COMPLIANCE  INJECTION IJ   insulin NPH Human 100 UNIT/ML injection Commonly known as: NOVOLIN N   Insulin Syringe-Needle U-100 30G X 5/16" 0.3 ML Misc       TAKE these medications    acetaminophen 500 MG tablet Commonly known as: TYLENOL Take 2 tablets (1,000 mg total) by mouth every 6 (six) hours.   ferrous sulfate 325 (65 FE) MG EC tablet Take 1 tablet (325 mg total) by mouth daily with breakfast. What changed: when to take this   ibuprofen 600 MG tablet Commonly known as: ADVIL Take 1 tablet (600 mg total) by mouth every 6 (six) hours.   oxyCODONE 5 MG immediate release tablet Commonly known as: Oxy IR/ROXICODONE Take 1 tablet (5 mg total) by mouth every 4 (four) hours as needed for moderate pain.   prenatal multivitamin Tabs tablet Take 1 tablet by mouth daily at 12 noon.         Discharge home in stable condition Infant Feeding: Bottle and Breast Infant Disposition:home with mother Discharge instruction: per After Visit Summary and Postpartum booklet. Activity: Advance as tolerated. Pelvic rest for 6 weeks.  Diet: routine diet Future Appointments: Future Appointments  Date Time Provider Department Center  10/12/2022  9:00 AM [facility] PAT 1 [facility] None  10/13/2022  3:50 PM [facility] NURSE [facility]  11/11/2022  9:00 AM [facility] LAB [facility]  11/11/2022  9:35 AM Alvester Morin, Isa Rankin, MD [facility]   Follow up Visit:  Message sent to [facility] by Autry-Lott on 10/08/2022  Please schedule this patient for a In person postpartum visit in 6 weeks with the following provider: MD and APP. Additional Postpartum F/U:2 hour GTT in 6 weeks and Incision check 1 week, PP pap High risk pregnancy complicated by: GDM and prior C/S, AMA, obesity Delivery mode:  C-Section, Low Transverse  Anticipated Birth Control:  Unsure   10/08/2022 Randa Evens Autry-Lott, DO

## 2022-10-06 NOTE — Transfer of Care (Signed)
Immediate Anesthesia Transfer of Care Note  Patient: Jenna Mayo  Procedure(s) Performed: CESAREAN SECTION  Patient Location: PACU  Anesthesia Type:Spinal  Level of Consciousness: awake  Airway & Oxygen Therapy: Patient Spontanous Breathing  Post-op Assessment: Report given to RN  Post vital signs: Reviewed and stable  Last Vitals:  Vitals Value Taken Time  BP 97/66 10/06/22 1701  Temp    Pulse 74 10/06/22 1703  Resp 17 10/06/22 1703  SpO2 99 % 10/06/22 1703  Vitals shown include unvalidated device data.  Last Pain: There were no vitals filed for this visit.       Complications: No notable events documented.

## 2022-10-06 NOTE — Progress Notes (Signed)
   PRENATAL VISIT NOTE  Subjective:  Jenna Mayo is a 47 y.o. G3P2002 at [redacted]w[redacted]d being seen today for ongoing prenatal care.  She is currently monitored for the following issues for this high-risk pregnancy and has Supervision of high risk pregnancy, antepartum; Previous cesarean section x 2 complicating pregnancy; Pregnancy resulting from in vitro fertilization, antepartum; Prediabetes; Diabetes in pregnancy; AMA (advanced maternal age) multigravida 75+; Obesity in pregnancy; and BMI 31.0-31.9,adult on their problem list.  Patient reports  leaking fluid since this am, bloody .   .  .   .    The following portions of the patient's history were reviewed and updated as appropriate: allergies, current medications, past family history, past medical history, past social history, past surgical history and problem list.   Objective:  There were no vitals filed for this visit.  Fetal Status:           General:  Alert, oriented and cooperative. Patient is in no acute distress.  Skin: Skin is warm and dry. No rash noted.   Cardiovascular: Normal heart rate noted  Respiratory: Normal respiratory effort, no problems with respiration noted  Abdomen: Soft, gravid, appropriate for gestational age.        Pelvic: Cervical exam performed in the presence of a chaperone + pooling, + nitrazine        Extremities: Normal range of motion.     Mental Status: Normal mood and affect. Normal behavior. Normal judgment and thought content.   Assessment and Plan:  Pregnancy: G3P2002 at [redacted]w[redacted]d Previous C-section x 2 + SROM For RCS-- Sent to Labor and delivery  Please refer to After Visit Summary for other counseling recommendations.   No follow-ups on file.  Future Appointments  Date Time Provider Department Center  10/12/2022  9:00 AM [facility] PAT 1 [facility] None    Reva Bores, MD

## 2022-10-06 NOTE — Anesthesia Preprocedure Evaluation (Addendum)
Anesthesia Evaluation  Patient identified by MRN, date of birth, ID band Patient awake    Reviewed: Allergy & Precautions, NPO status , Patient's Chart, lab work & pertinent test results  History of Anesthesia Complications Negative for: history of anesthetic complications  Airway Mallampati: III  TM Distance: >3 FB Neck ROM: Full    Dental  (+) Dental Advisory Given   Pulmonary neg pulmonary ROS   Pulmonary exam normal breath sounds clear to auscultation       Cardiovascular negative cardio ROS  Rhythm:Regular Rate:Normal     Neuro/Psych negative neurological ROS     GI/Hepatic negative GI ROS, Neg liver ROS,,,  Endo/Other  diabetes, Gestational    Renal/GU negative Renal ROS     Musculoskeletal   Abdominal  (+) + obese  Peds  Hematology negative hematology ROS (+)   Anesthesia Other Findings Previous c-section x2  Takes baby aspirin. Last dose 5 days ago.  Reproductive/Obstetrics (+) Pregnancy                             Anesthesia Physical Anesthesia Plan  ASA: 2  Anesthesia Plan: Spinal and Epidural   Post-op Pain Management:    Induction:   PONV Risk Score and Plan: 2 and Ondansetron, Dexamethasone and Treatment may vary due to age or medical condition  Airway Management Planned: Natural Airway  Additional Equipment:   Intra-op Plan:   Post-operative Plan:   Informed Consent: I have reviewed the patients History and Physical, chart, labs and discussed the procedure including the risks, benefits and alternatives for the proposed anesthesia with the patient or authorized representative who has indicated his/her understanding and acceptance.     Dental advisory given  Plan Discussed with: CRNA and Anesthesiologist  Anesthesia Plan Comments: (I have discussed risks of neuraxial anesthesia including but not limited to infection, bleeding, nerve injury, back pain,  headache, seizures, and failure of block. Patient denies bleeding disorders and is not currently anticoagulated. Labs have been reviewed. Risks and benefits discussed. All patient's questions answered.  )       Anesthesia Quick Evaluation

## 2022-10-06 NOTE — Anesthesia Procedure Notes (Signed)
Epidural Patient location during procedure: OB Start time: 10/06/2022 3:27 PM End time: 10/06/2022 3:32 PM  Staffing Anesthesiologist: Linton Rump, MD Performed: anesthesiologist   Preanesthetic Checklist Completed: patient identified, IV checked, site marked, risks and benefits discussed, surgical consent, monitors and equipment checked, pre-op evaluation and timeout performed  Epidural Patient position: sitting Prep: DuraPrep and site prepped and draped Patient monitoring: continuous pulse ox and blood pressure Approach: midline Location: L3-L4 Injection technique: LOR air  Needle:  Needle type: Tuohy  Needle gauge: 17 G Needle length: 9 cm and 9 Needle insertion depth: 5 cm Catheter type: closed end flexible Catheter size: 19 Gauge Catheter at skin depth: 9 cm Test dose: negative  Assessment Events: blood not aspirated, injection not painful, no injection resistance, no paresthesia and negative IV test  Additional Notes CSE performed for patient's third c-section, most recent one was 19 years ago. Risks and benefits including, but not limited to, infection, bleeding, local anesthetic toxicity, headache, hypotension, back pain, block failure, etc. were discussed with the patient. The patient expressed understanding and consented to the procedure. I confirmed that the patient has no bleeding disorders and is not taking blood thinners. I confirmed the patient's last platelet count with the nurse. A time-out was performed immediately prior to the procedure. Please see nursing documentation for vital signs. Sterile technique was used throughout the whole procedure. Once LOR achieved, the spinal needle was passed through the Tuohy needle with return of clear CSF. The spinal dose was administered as per the  [facility]. The spinal needle was then removed. The epidural catheter threaded easily without resistance. Aspiration of the catheter was negative for blood and CSF.   1  attempt(s)Reason for block:procedure for pain

## 2022-10-06 NOTE — Anesthesia Postprocedure Evaluation (Signed)
Anesthesia Post Note  Patient: Jenna Mayo  Procedure(s) Performed: CESAREAN SECTION     Patient location during evaluation: PACU Anesthesia Type: Spinal Level of consciousness: oriented and awake and alert Pain management: pain level controlled Vital Signs Assessment: post-procedure vital signs reviewed and stable Respiratory status: spontaneous breathing, respiratory function stable and nonlabored ventilation Cardiovascular status: blood pressure returned to baseline and stable Postop Assessment: no headache, no backache, no apparent nausea or vomiting, spinal receding and patient able to bend at knees Anesthetic complications: no   No notable events documented.  Last Vitals:  Vitals:   10/06/22 1759 10/06/22 1800  BP: (!) 89/39 (!) 89/39  Pulse: 64 62  Resp: 14 18  Temp:    SpO2: 98% 97%    Last Pain:  Vitals:   10/06/22 1759  PainSc: 0-No pain   Pain Goal:    LLE Motor Response: Purposeful movement (10/06/22 1759) LLE Sensation: Tingling (10/06/22 1759) RLE Motor Response: Purposeful movement (10/06/22 1759) RLE Sensation: Tingling (10/06/22 1759) L Sensory Level: T10-Umbilical region (10/06/22 1759) R Sensory Level: T10-Umbilical region (10/06/22 1759) Epidural/Spinal Function Cutaneous sensation: Able to Discern Pressure (10/06/22 1759), Patient able to flex knees: Yes (10/06/22 1759), Patient able to lift hips off bed: No (10/06/22 1759), Back pain beyond tenderness at insertion site: No (10/06/22 1759), Progressively worsening motor and/or sensory loss: No (10/06/22 1759), Bowel and/or bladder incontinence post epidural: No (10/06/22 1759)  Linton Rump

## 2022-10-06 NOTE — H&P (Signed)
Obstetric Preoperative History and Physical  Jenna Mayo is a 47 y.o. [phone] with IUP at [redacted]w[redacted]d presenting for ROM with prior CSx2.  No acute concerns.   Prenatal Course Source of Care: [facility]  with onset of care at 12 weeks Pregnancy complications or risks: Patient Active Problem List   Diagnosis Date Noted   AMA (advanced maternal age) multigravida 40+ 06/08/2022   Obesity in pregnancy 06/08/2022   BMI 31.0-31.9,adult 06/08/2022   Diabetes in pregnancy 05/11/2022   Prediabetes 04/12/2022   Supervision of high risk pregnancy, antepartum 04/11/2022   Previous cesarean section x 2 complicating pregnancy 04/11/2022   Pregnancy resulting from in vitro fertilization, antepartum 04/11/2022   She plans to breastfeed She desires natural family planning (NFP) for postpartum contraception.   Prenatal labs and studies: ABO, Rh: --/--/A POS (06/06 1221) Antibody: NEG (06/06 1221) Rubella: 11.00 (12/11 1547) RPR: Non Reactive (04/25 1552)  HBsAg: Negative (12/11 1547)  HIV: Non Reactive (04/25 1552)  AVW:UJWJXBJY/-- (05/29 1600) 2 hr Glucola  FAILED-- GDM Genetic screening normal Anatomy US normal  Prenatal Transfer Tool  Maternal Diabetes: Yes:  Diabetes Type:  Insulin/Medication controlled Genetic Screening: Normal Maternal Ultrasounds/Referrals: Normal Fetal Ultrasounds or other Referrals:  None Maternal Substance Abuse:  No Significant Maternal Medications:  Meds include: Other:  Insulin Significant Maternal Lab Results: Group B Strep negative  Past Medical History:  Diagnosis Date   Allergy     Past Surgical History:  Procedure Laterality Date   CESAREAN SECTION     x2    OB History  Gravida Para Term Preterm AB Living  3 2 2    0 2  SAB IAB Ectopic Multiple Live Births  0 0 0   2    # Outcome Date GA Lbr Len/2nd Weight Sex Delivery Anes PTL Lv  3 Current           2 Term 2004 [redacted]w[redacted]d  3629 g F CS-Unspec   LIV  1 Term 2003 [redacted]w[redacted]d  3317 g   CS-Unspec   LIV     Birth Comments: no complications    Social History   Socioeconomic History   Marital status: Married    Spouse name: Not on file   Number of children: Not on file   Years of education: Not on file   Highest education level: Not on file  Occupational History   Not on file  Tobacco Use   Smoking status: Never   Smokeless tobacco: Never  Vaping Use   Vaping Use: Never used  Substance and Sexual Activity   Alcohol use: No   Drug use: No   Sexual activity: Yes    Birth control/protection: None    Comment: 1st intercourse- 77, partners- 1,   Other Topics Concern   Not on file  Social History Narrative   Not on file   Social Determinants of Health   Financial Resource Strain: Not on file  Food Insecurity: No Food Insecurity (05/11/2022)   Hunger Vital Sign    Worried About Running Out of Food in the Last Year: Never true    Ran Out of Food in the Last Year: Never true  Transportation Needs: Not on file  Physical Activity: Not on file  Stress: Not on file  Social Connections: Not on file    Family History  Problem Relation Age of Onset   Diabetes Mother    Diabetes Father    Diabetes Brother    Diabetes Brother  Medications Prior to Admission  Medication Sig Dispense Refill Last Dose   Accu-Chek Softclix Lancets lancets 1 each by Other route 4 (four) times daily. 100 each 12 10/05/2022   ferrous sulfate 325 (65 FE) MG EC tablet Take 325 mg by mouth 3 (three) times daily with meals.   10/05/2022   glucose blood (ACCU-CHEK GUIDE) test strip Use to check blood sugars four times a day was instructed 50 each 12 10/05/2022   insulin NPH Human (NOVOLIN N) 100 UNIT/ML injection Inject 0.12 mLs (12 Units total) into the skin at bedtime. 10 mL 3 10/05/2022   Prenatal Vit-Fe Fumarate-FA (PRENATAL MULTIVITAMIN) TABS tablet Take 1 tablet by mouth daily at 12 noon.   10/05/2022   aspirin EC 81 MG tablet Take 1 tablet (81 mg total) by mouth daily. Take after 12 weeks  for prevention of preeclampsia later in pregnancy 300 tablet 2 09/29/2022   Blood Glucose Monitoring Suppl (ACCU-CHEK GUIDE) w/Device KIT 1 Device by Does not apply route 4 (four) times daily. 1 kit 0    Cyanocobalamin (B-12 COMPLIANCE INJECTION IJ) Inject as directed.   Unknown   Insulin Syringe-Needle U-100 30G X 5/16" 0.3 ML MISC 1 Syringe by Does not apply route 4 (four) times daily as needed. 90 each 1     No Known Allergies  Review of Systems: Negative except for what is mentioned in HPI.  Physical Exam: BP 116/72   Pulse 77   Resp 17   LMP 01/14/2022   SpO2 98%  FHR by Doppler: 140 bpm CONSTITUTIONAL: Well-developed, well-nourished female in no acute distress.  HENT:  Normocephalic, atraumatic, External right and left ear normal. Oropharynx is clear and moist EYES: Conjunctivae and EOM are normal. Pupils are equal, round, and reactive to light. No scleral icterus.  NECK: Normal range of motion, supple, no masses SKIN: Skin is warm and dry. No rash noted. Not diaphoretic. No erythema. No pallor. NEUROLGIC: Alert and oriented to person, place, and time. Normal reflexes, muscle tone coordination. No cranial nerve deficit noted. PSYCHIATRIC: Normal mood and affect. Normal behavior. Normal judgment and thought content. CARDIOVASCULAR: Normal heart rate noted, regular rhythm RESPIRATORY: Effort and breath sounds normal, no problems with respiration noted ABDOMEN: Soft, nontender, nondistended, gravid. Well-healed Pfannenstiel incision. PELVIC: Deferred MUSCULOSKELETAL: Normal range of motion. No edema and no tenderness. 2+ distal pulses.   Pertinent Labs/Studies:   Results for orders placed or performed during the hospital encounter of 10/06/22 (from the past 72 hour(s))  CBC     Status: None   Collection Time: 10/06/22 12:21 PM  Result Value Ref Range   WBC 7.6 4.0 - 10.5 K/uL   RBC 4.32 3.87 - 5.11 MIL/uL   Hemoglobin 12.1 12.0 - 15.0 g/dL   HCT 16.1 09.6 - 04.5 %   MCV 85.9  80.0 - 100.0 fL   MCH 28.0 26.0 - 34.0 pg   MCHC 32.6 30.0 - 36.0 g/dL   RDW 40.9 81.1 - 91.4 %   Platelets 261 150 - 400 K/uL   nRBC 0.0 0.0 - 0.2 %    Comment: Performed at [facility], [address] [place], [place] [address]  Type and screen     Status: None   Collection Time: 10/06/22 12:21 PM  Result Value Ref Range   ABO/RH(D) A POS    Antibody Screen NEG    Sample Expiration      10/09/2022,2359 Performed at [facility], [address] [place], [place]  [address]     Assessment and Plan :Camrey Cairnes Mayo is a 47 y.o. G3P2002 at [redacted]w[redacted]d being admitted being admitted for PROM.  Patient was initially endorsing wanting TOLAC. We discussed this at length. Patient with a history of c-section for fetal intolerance and then a second CS was scheduled.   We reviewed that a TOLAC is reasonable for those with history of one prior C-section and in some cases for those with two prior C-sections.  I reviewed the risk of uterine rupture in detail.  Patient decided to have repeat c-section.  She is thinking about another IVF pregnancy in the future.   The risks of cesarean section discussed with the patient included but were not limited to: bleeding which may require transfusion or reoperation; infection which may require antibiotics; injury to bowel, bladder, ureters or other surrounding organs; injury to the fetus; need for additional procedures including hysterectomy in the event of a life-threatening hemorrhage; placental abnormalities wth subsequent pregnancies, incisional problems, thromboembolic phenomenon and other postoperative/anesthesia complications. The patient concurred with the proposed plan, giving informed written consent for the procedure. Patient has been NPO since last night she will remain NPO for procedure. Anesthesia and OR aware. Preoperative prophylactic antibiotics and SCDs ordered on call to the OR. To OR when ready.   Last food/solid at 8 PM  on 6/5 Last water at 05:30 AM 6/6   Federico Flake, MD, MPH, ABFM Attending Physician Center for [facility]

## 2022-10-06 NOTE — Op Note (Addendum)
Jenna Mayo Jenna Mayo PROCEDURE DATE: 10/06/2022  PREOPERATIVE DIAGNOSES: Intrauterine pregnancy at [redacted]w[redacted]d weeks gestation;  ROM and repeat C/S; declined vaginal attempt  POSTOPERATIVE DIAGNOSES: The same  PROCEDURE: Low Transverse Cesarean Section  SURGEON:  Dr. Alvester Morin  ASSISTANT:  Dr. Salvadore Dom. An experienced assistant was required given the standard of surgical care given the complexity of the case.  This assistant was needed for exposure, dissection, suctioning, retraction, instrument exchange, assisting with delivery with administration of fundal pressure, and for overall help during the procedure.  ANESTHESIOLOGY TEAM: Anesthesiologist: Linton Rump, MD CRNA: Algis Greenhouse, CRNA  INDICATIONS: Jenna Mayo is a 47 y.o. [phone] at [redacted]w[redacted]d here for cesarean section secondary to the indications listed under preoperative diagnoses; please see preoperative note for further details.  The risks of surgery were discussed with the patient including but were not limited to: bleeding which may require transfusion or reoperation; infection which may require antibiotics; injury to bowel, bladder, ureters or other surrounding organs; injury to the fetus; need for additional procedures including hysterectomy in the event of a life-threatening hemorrhage; formation of adhesions; placental abnormalities wth subsequent pregnancies; incisional problems; thromboembolic phenomenon and other postoperative/anesthesia complications.  The patient concurred with the proposed plan, giving informed written consent for the procedure.    FINDINGS:  Viable female infant in cephalic presentation.  Apgars 9 and 9.  Clear amniotic fluid.  Intact placenta, three vessel cord.  Normal uterus, fallopian tubes and ovaries bilaterally. LUS vertical extension. Mild to moderate adhesive disease on uterus.   ANESTHESIA: Spinal INTRAVENOUS FLUIDS: 2500 ml   ESTIMATED BLOOD LOSS: 1071  ml URINE OUTPUT:  175 ml SPECIMENS: Placenta sent to L&D COMPLICATIONS: None immediate  PROCEDURE IN DETAIL:  The patient preoperatively received intravenous antibiotics and had sequential compression devices applied to her lower extremities.  She was then taken to the operating room where spinal anesthesia was administered and was found to be adequate. She was then placed in a dorsal supine position with a leftward tilt, and prepped and draped in a sterile manner.  A foley catheter was placed into her bladder and attached to constant gravity.  After an adequate timeout was performed, a Pfannenstiel skin incision was made with scalpel on her preexisting scar and carried through to the underlying layer of fascia. The fascia was incised in the midline, and this incision was extended bilaterally using the Mayo scissors.  Kocher clamps were applied to the superior aspect of the fascial incision and the underlying rectus muscles were dissected off bluntly and sharply.  A similar process was carried out on the inferior aspect of the fascial incision. The rectus muscles were separated in the midline and the peritoneum was entered bluntly. The Alexis self-retaining retractor was introduced into the abdominal cavity.  Attention was turned to the lower uterine segment where a low transverse hysterotomy was made with a scalpel and extended bilaterally bluntly.  The infant was successfully delivered, the cord was clamped and cut after one minute, and the infant was handed over to the awaiting neonatology team. Uterine massage was then administered, and the placenta delivered intact with a three-vessel cord.   The uterus was then cleared of clots and debris. There was anterior uterine wall defect posterior to the bladder noted prior to closure. The hysterotomy and defect was closed with 0 Vicryl in a running locked fashion. Figure-of-eight 0 Vicryl serosal stitches were placed to help with hemostasis. The pelvis was  cleared of all clot and  debris. Hemostasis was confirmed on all surfaces.  The retractor was removed.  The peritoneum was closed with a 0 Vicryl running stitch. The fascia, which the lower right edge was adherent to the rectus, was then closed using 0 PDS in a running fashion.  The subcutaneous layer was irrigated, re-approximated with 2-0 plain gut interrupted stitches, and the skin was closed with a 4-0 Vicryl subcuticular stitch. The patient tolerated the procedure well. Sponge, instrument and needle counts were correct x 3.  She was taken to the recovery room in stable condition.   Jenna Jumbo, DO OB Fellow, Faculty Practice [facility], Center for [facility] 10/06/2022, 6:10 PM  Attestation of Attending Supervision of OB Fellow: Evaluation and management procedures were performed by the Family Medicine OB Fellow under my supervision.  I have reviewed the Fellow's note and chart. I was gloved and gowned for the entirety of the procedure and involved during the case. I have made any necessary editorial changes.    Jenna Flake, MD, MPH, ABFM Attending Physician Center for [facility]

## 2022-10-06 NOTE — Progress Notes (Signed)
Leaking since 9am with some spotting

## 2022-10-07 LAB — CBC
HCT: 26.5 % — ABNORMAL LOW (ref 36.0–46.0)
Hemoglobin: 8.6 g/dL — ABNORMAL LOW (ref 12.0–15.0)
MCH: 28 pg (ref 26.0–34.0)
MCHC: 32.5 g/dL (ref 30.0–36.0)
MCV: 86.3 fL (ref 80.0–100.0)
Platelets: 204 10*3/uL (ref 150–400)
RBC: 3.07 MIL/uL — ABNORMAL LOW (ref 3.87–5.11)
RDW: 15.3 % (ref 11.5–15.5)
WBC: 11 10*3/uL — ABNORMAL HIGH (ref 4.0–10.5)
nRBC: 0 % (ref 0.0–0.2)

## 2022-10-07 LAB — GLUCOSE, CAPILLARY: Glucose-Capillary: 83 mg/dL (ref 70–99)

## 2022-10-07 LAB — RPR: RPR Ser Ql: NONREACTIVE

## 2022-10-07 MED ORDER — FERROUS SULFATE 325 (65 FE) MG PO TABS
325.0000 mg | ORAL_TABLET | ORAL | Status: DC
  Administered 2022-10-07: 325 mg via ORAL
  Filled 2022-10-07: qty 1

## 2022-10-07 MED ORDER — LACTATED RINGERS IV BOLUS
1000.0000 mL | Freq: Once | INTRAVENOUS | Status: AC
  Administered 2022-10-07: 1000 mL via INTRAVENOUS

## 2022-10-07 NOTE — Lactation Note (Signed)
This note was copied from a baby's chart. Lactation Consultation Note  Patient Name: Boy Lanyla Costello Rosa-Tovar [id number] Date: 10/07/2022 Age:47 hours Reason for consult: Initial assessment;Early term 37-38.6wks Attempted to consult and helping w/latching. Baby is very spitty at this time. LC changed baby's bed of emesis.  Newborn feeding habits, STS, I&O  reviewed. Mom encouraged to feed baby 8-12 times/24 hours and with feeding cues.  Mom hadn't felt very well either. Mom stated it would be better to come back later when they both are feeling better. Encouraged mom to call if needed.  Maternal Data Does the patient have breastfeeding experience prior to this delivery?: Yes How long did the patient breastfeed?: 1 yr  Feeding    LATCH Score       Type of Nipple: Everted at rest and after stimulation  Comfort (Breast/Nipple): Soft / non-tender         Lactation Tools Discussed/Used    Interventions Interventions: Breast feeding basics reviewed;LC Services brochure  Discharge    Consult Status Consult Status: Follow-up Date: 10/07/22 Follow-up type: In-patient    Thang Flett, Diamond Nickel 10/07/2022, 5:05 AM

## 2022-10-07 NOTE — Lactation Note (Signed)
This note was copied from a baby's chart. Lactation Consultation Note  Patient Name: Jenna Mayo [id number] Date: 10/07/2022 Age:47 hours Reason for consult: Follow-up assessment;Early term 37-38.6wks;Infant weight loss;Breastfeeding assistance (4.12% WL)  The infant was at 51 hours old.  LC entered the room and the birth parent was STS with the infant.  Per the birth parent the infant is doing well at the breast.  The birth parent denies any pain with breastfeeding but states that she does feel some pinching.  LC encouraged the birth parent to tug down on the infant's chin to open up the latch.  The birth parent attempted to latch the infant and the infant was sleepy and uninterested in feeding.  LC reviewed feeding frequency and answered the parent's questions.  The birth parent does not have a breast pump at home.  She said that she has ordered the momcozy.  The birth parent asked for a manual breast pump and was fitted for a size #24 flange, but was told that she may have to increase to a size #27 when her milk volume increases.  LC reviewed assembling, disassembling, and washing pump parts.  The birth parent was given supplementation guidelines and milk storage guidelines hand outs.  LC reviewed engorgement, breast care, warning signs, and infant I/O.  All questions were answered.   Infant Feeding Plan:  Breastfeed 8+ times per day according to feeding cues.  Wake the infant to feed him if he has been asleep for 3 hours.  Watch infant output and call the pediatrician with questions or concerns.  Call outpatient [facility] for assistance with breastfeeding.  Use the manual pump a few times per day/if your breasts still feel full after using the hands-free pump.  Prioritize maternal rest, hydration, and nutrition.    Maternal Data Does the patient have breastfeeding experience prior to this delivery?: Yes How long did the patient breastfeed?: 1 yr  Feeding Mother's  Current Feeding Choice: Breast Milk and Formula  LATCH Score       Type of Nipple: Everted at rest and after stimulation  Comfort (Breast/Nipple): Soft / non-tender         Lactation Tools Discussed/Used Tools: Pump;Flanges Flange Size: 27;24 Breast pump type: Double-Electric Breast Pump Pump Education: Setup, frequency, and cleaning;Milk Storage  Interventions Interventions: Breast feeding basics reviewed;LC Services brochure  Discharge Discharge Education: Engorgement and breast care;Warning signs for feeding baby;Outpatient recommendation  Consult Status Consult Status: Follow-up Date: 10/08/22 Follow-up type: In-patient    Delene Loll 10/07/2022, 8:27 AM

## 2022-10-07 NOTE — Progress Notes (Signed)
Post Partum Day 1 Subjective: Jenna Mayo is doing well this morning. She reports some mild incisional pain that is controlled with PO meds. She is otherwise without complaints. She is up ad lib, tolerating PO, and + flatus. Foley cath still in place given minimal urine output. Patient was given IVF bolus and encouraged to push PO fluids.   Objective: Blood pressure 90/68, pulse 85, temperature 98 F (36.7 C), resp. rate 18, last menstrual period 01/14/2022, SpO2 99 %, unknown if currently breastfeeding.  Physical Exam:  General: alert and no distress Lochia: appropriate Uterine Fundus: firm Incision: healing well, no significant drainage, no dehiscence, no significant erythema DVT Evaluation: No evidence of DVT seen on physical exam.  Recent Labs    10/06/22 1221 10/07/22 0509  HGB 12.1 8.6*  HCT 37.1 26.5*    Assessment/Plan: PPD#1 rLTCS: Continue to push PO fluids. Remove foley cath.  Anemia: Asymptomatic. PO iron ordered Breast/bottle feeding Plan for discharge tomorrow   LOS: 1 day   Brand Males, CNM 10/07/2022, 11:38 AM

## 2022-10-07 NOTE — Progress Notes (Signed)
Spoke with Dr. Camelia Phenes regarding pt BP of 76/53. Pt asymptomatic. MD also aware of low output over the past 4 hrs, which was approximately 50 ml. 1,000 ml LR bolus ordered. BP recheck after bolus given.

## 2022-10-07 NOTE — Progress Notes (Signed)
Patient told to drink fluids, urine output still at minimal amount for 30cc an hour. Patient is not dizzy and has been explained to about medications and knows to ask for narcotics.

## 2022-10-08 LAB — TYPE AND SCREEN
ABO/RH(D): A POS
Antibody Screen: NEGATIVE
Unit division: 0
Unit division: 0

## 2022-10-08 LAB — BPAM RBC
Blood Product Expiration Date: 202406282359
Blood Product Expiration Date: 202406282359
Unit Type and Rh: 6200

## 2022-10-08 LAB — GLUCOSE, CAPILLARY: Glucose-Capillary: 71 mg/dL (ref 70–99)

## 2022-10-08 MED ORDER — FERROUS SULFATE 325 (65 FE) MG PO TBEC: 325.0000 mg | DELAYED_RELEASE_TABLET | Freq: Every day | ORAL | 0 refills | Status: AC

## 2022-10-08 MED ORDER — ACETAMINOPHEN 500 MG PO TABS: 1000.0000 mg | ORAL_TABLET | Freq: Four times a day (QID) | ORAL | 0 refills | Status: AC

## 2022-10-08 MED ORDER — OXYCODONE HCL 5 MG PO TABS: 5.0000 mg | ORAL_TABLET | ORAL | 0 refills | Status: AC | PRN

## 2022-10-08 MED ORDER — IBUPROFEN 600 MG PO TABS: 600.0000 mg | ORAL_TABLET | Freq: Four times a day (QID) | ORAL | 0 refills | Status: AC

## 2022-10-08 NOTE — Progress Notes (Addendum)
CSW was consulted due to MOB having questions about Medicaid. CSW met with MOB at bedside to offer support. When CSW entered room, MOB was standing in room with FOB nearby. Infant was observed asleep on his back in bassinet. CSW introduced self and explained reason for visit. MOB confirmed that she is interested in applying for "Emergency Medicaid" for herself and Medicaid for infant. CSW offered to notify [facility] Financial Navigator of MOB's request with completing Medicaid applications and also provided MOB with website to apply for Medicaid. MOB provided verbal consent for CSW to notify [facility] Financial Navigator of MOB's request via email. MOB expressed appreciation and declined additional resource needs at this time.  Signed,  Norberto Sorenson, MSW, LCSWA, LCASA 10/08/2022 4:56 PM

## 2022-10-12 ENCOUNTER — Other Ambulatory Visit: Payer: Self-pay

## 2022-10-12 ENCOUNTER — Encounter (HOSPITAL_COMMUNITY)
Admission: RE | Admit: 2022-10-12 | Discharge: 2022-10-12 | Disposition: A | Discharge status: Home / Self Care | Payer: Self-pay | Source: Ambulatory Visit | Attending: Obstetrics and Gynecology | Admitting: Obstetrics and Gynecology

## 2022-10-12 ENCOUNTER — Encounter: Payer: Self-pay | Admitting: Obstetrics and Gynecology

## 2022-10-13 ENCOUNTER — Ambulatory Visit (INDEPENDENT_AMBULATORY_CARE_PROVIDER_SITE_OTHER): Payer: Self-pay | Admitting: *Deleted

## 2022-10-13 VITALS — BP 136/87 | HR 89

## 2022-10-13 DIAGNOSIS — Z98891 History of uterine scar from previous surgery: Secondary | ICD-10-CM

## 2022-10-13 NOTE — Progress Notes (Signed)
Subjective:     Jenna Mayo is a 48 y.o. female who presents to the clinic 1 weeks status post low uterine, transverse cesarean section. Pt reports incision is healing well.      Objective:    BP 136/87   Pulse 89  General:  alert, well appearing, in no apparent distress  Incision:   healing well, no drainage, no erythema, no hernia, no seroma, no swelling, no dehiscence, incision well approximated     Assessment:    Doing well postoperatively.   Plan:    1. Continue any current medications. 2. Wound care discussed. 3. Follow up: as needed and or at postpartum visit.   Scheryl Marten, RN

## 2022-10-14 ENCOUNTER — Inpatient Hospital Stay (HOSPITAL_COMMUNITY): Admission: RE | Admit: 2022-10-14 | Payer: Self-pay | Source: Home / Self Care | Admitting: Obstetrics and Gynecology

## 2022-11-11 ENCOUNTER — Encounter: Payer: Self-pay | Admitting: Family Medicine

## 2022-11-11 ENCOUNTER — Other Ambulatory Visit: Payer: Self-pay

## 2022-11-11 ENCOUNTER — Ambulatory Visit (INDEPENDENT_AMBULATORY_CARE_PROVIDER_SITE_OTHER): Payer: Self-pay | Admitting: Family Medicine

## 2022-11-11 ENCOUNTER — Other Ambulatory Visit (HOSPITAL_COMMUNITY)
Admission: RE | Admit: 2022-11-11 | Discharge: 2022-11-11 | Disposition: A | Discharge status: Home / Self Care | Payer: Self-pay | Source: Ambulatory Visit | Attending: Family Medicine | Admitting: Family Medicine

## 2022-11-11 DIAGNOSIS — O24414 Gestational diabetes mellitus in pregnancy, insulin controlled: Secondary | ICD-10-CM

## 2022-11-11 DIAGNOSIS — D649 Anemia, unspecified: Secondary | ICD-10-CM

## 2022-11-11 DIAGNOSIS — Z124 Encounter for screening for malignant neoplasm of cervix: Secondary | ICD-10-CM | POA: Insufficient documentation

## 2022-11-11 LAB — CBC
Hematocrit: 40 % (ref 34.0–46.6)
Hemoglobin: 12.7 g/dL (ref 11.1–15.9)
MCH: 27 pg (ref 26.6–33.0)
MCHC: 31.8 g/dL (ref 31.5–35.7)
MCV: 85 fL (ref 79–97)
Platelets: 281 10*3/uL (ref 150–450)
RBC: 4.7 x10E6/uL (ref 3.77–5.28)
RDW: 12.6 % (ref 11.7–15.4)
WBC: 5.9 10*3/uL (ref 3.4–10.8)

## 2022-11-11 NOTE — Progress Notes (Signed)
Post Partum Visit Note  Jenna Mayo is a 47 y.o. [phone] female who presents for a postpartum visit. She is 5 weeks postpartum following a repeat cesarean section.  I have fully reviewed the prenatal and intrapartum course. The delivery was at [redacted] weeks gestational weeks.  Anesthesia: spinal. Postpartum course has been well. Baby is doing well . Baby is feeding by both breast and bottle - Similac Advance. Bleeding staining only. Bowel function is normal. Bladder function is normal. Patient is not sexually active. Contraception method is none. Postpartum depression screening: negative.   The pregnancy intention screening data noted above was reviewed. Potential methods of contraception were discussed. The patient elected to proceed with No data recorded.   Edinburgh Postnatal Depression Scale - 11/11/22 0939       Edinburgh Postnatal Depression Scale:  In the Past 7 Days   I have been able to laugh and see the funny side of things. 0    I have looked forward with enjoyment to things. 0    I have blamed myself unnecessarily when things went wrong. 0    I have been anxious or worried for no good reason. 0    I have felt scared or panicky for no good reason. 0    Things have been getting on top of me. 0    I have been so unhappy that I have had difficulty sleeping. 0    I have felt sad or miserable. 0    I have been so unhappy that I have been crying. 0    The thought of harming myself has occurred to me. 0    Edinburgh Postnatal Depression Scale Total 0             Health Maintenance Due  Topic Date Due   FOOT EXAM  Never done   OPHTHALMOLOGY EXAM  Never done   Diabetic kidney evaluation - Urine ACR  Never done   DTaP/Tdap/Td (1 - Tdap) Never done   Diabetic kidney evaluation - eGFR measurement  09/26/2019   Colonoscopy  Never done   PAP SMEAR-Modifier  09/19/2021   COVID-19 Vaccine (1 - 2023-24 season) Never done    The following portions of the  patient's history were reviewed and updated as appropriate: allergies, current medications, past family history, past medical history, past social history, past surgical history, and problem list.  Review of Systems Pertinent items are noted in HPI.  Objective:  BP 127/85   Pulse 63   Wt 141 lb 3.2 oz (64 kg)   LMP 01/14/2022   Breastfeeding Yes   BMI 28.52 kg/m    General:  alert, cooperative, and appears stated age   Breasts:  not indicated  Lungs: clear to auscultation bilaterally  Heart:  regular rate and rhythm, S1, S2 normal, no murmur, click, rub or gallop  Abdomen: soft, non-tender; bowel sounds normal; no masses,  no organomegaly   Wound well approximated incision  GU exam:  normal, chaperone present, pap obtained       Assessment:    There are no diagnoses linked to this encounter.  Normal postpartum exam.   Plan:   Essential components of care per ACOG recommendations:  1.  Mood and well being: Patient with negative depression screening today. Reviewed local resources for support.  - Patient tobacco use? No.   - hx of drug use? No.    2. Infant care and feeding:  -Patient currently breastmilk feeding? Yes. Reviewed  importance of draining breast regularly to support lactation.  -Social determinants of health (SDOH) reviewed in EPIC. No concerns  3. Sexuality, contraception and birth spacing - Patient does want a pregnancy in the next year.  Desired family size is 4 children.  - Reviewed reproductive life planning. Reviewed contraceptive methods based on pt preferences and effectiveness.  Patient desired No Method - Other Reason today.  Used IVF for pregnancy and asked about when to seek another pregnancy> has embryos. Reviewed talking to REI but also recommended waiting from a surgical standpoint until 10-12 month PP.  - Discussed birth spacing of 18 months  4. Sleep and fatigue -Encouraged family/partner/community support of 4 hrs of uninterrupted sleep to  help with mood and fatigue  5. Physical Recovery  - Discussed patients delivery and complications. She describes her labor as good. - Patient had a C-section repeat; no problems after deliver.  - Patient has urinary incontinence? No. - Patient is safe to resume physical and sexual activity  6.  Health Maintenance - HM due items addressed Yes - Last pap smear No results found for: "DIAGPAP" Pap smear done at today's visit.  -Breast Cancer screening indicated? No.   7. Chronic Disease/Pregnancy Condition follow up: Anemia and Gestational Diabetes  1. Cervical cancer screening - Cytology - PAP  2. Insulin controlled gestational diabetes mellitus (GDM) in third trimester - Completeing GTT today  3. Anemia- CBC today  - PCP follow up  Federico Flake, MD [facility] for [facility], [facility]

## 2022-11-12 LAB — GLUCOSE TOLERANCE, 2 HOURS
Glucose, 2 hour: 149 mg/dL — ABNORMAL HIGH (ref 70–139)
Glucose, GTT - Fasting: 105 mg/dL — ABNORMAL HIGH (ref 70–99)

## 2022-11-15 LAB — CYTOLOGY - PAP
Comment: NEGATIVE
Diagnosis: NEGATIVE
High risk HPV: NEGATIVE
